# Patient Record
Sex: Female | Born: 1940 | Race: White | Hispanic: No | Smoking: Former smoker
Health system: Southern US, Community
[De-identification: ages and names within clinical notes are randomized; demographics above are authoritative.]

## PROBLEM LIST (undated history)

## (undated) DIAGNOSIS — Z923 Personal history of irradiation: Secondary | ICD-10-CM

## (undated) DIAGNOSIS — Z9221 Personal history of antineoplastic chemotherapy: Secondary | ICD-10-CM

## (undated) DIAGNOSIS — K5792 Diverticulitis of intestine, part unspecified, without perforation or abscess without bleeding: Secondary | ICD-10-CM

## (undated) DIAGNOSIS — T7840XA Allergy, unspecified, initial encounter: Secondary | ICD-10-CM

## (undated) DIAGNOSIS — E079 Disorder of thyroid, unspecified: Secondary | ICD-10-CM

## (undated) DIAGNOSIS — J189 Pneumonia, unspecified organism: Secondary | ICD-10-CM

## (undated) DIAGNOSIS — K219 Gastro-esophageal reflux disease without esophagitis: Secondary | ICD-10-CM

## (undated) DIAGNOSIS — M1990 Unspecified osteoarthritis, unspecified site: Secondary | ICD-10-CM

## (undated) DIAGNOSIS — C50919 Malignant neoplasm of unspecified site of unspecified female breast: Secondary | ICD-10-CM

## (undated) DIAGNOSIS — C801 Malignant (primary) neoplasm, unspecified: Secondary | ICD-10-CM

## (undated) DIAGNOSIS — K469 Unspecified abdominal hernia without obstruction or gangrene: Secondary | ICD-10-CM

## (undated) HISTORY — PX: ABDOMINAL HYSTERECTOMY: SHX81

## (undated) HISTORY — DX: Pneumonia, unspecified organism: J18.9

## (undated) HISTORY — DX: Malignant (primary) neoplasm, unspecified: C80.1

## (undated) HISTORY — PX: CHOLECYSTECTOMY: SHX55

## (undated) HISTORY — DX: Unspecified abdominal hernia without obstruction or gangrene: K46.9

## (undated) HISTORY — DX: Unspecified osteoarthritis, unspecified site: M19.90

## (undated) HISTORY — DX: Diverticulitis of intestine, part unspecified, without perforation or abscess without bleeding: K57.92

## (undated) HISTORY — DX: Disorder of thyroid, unspecified: E07.9

## (undated) HISTORY — DX: Gastro-esophageal reflux disease without esophagitis: K21.9

## (undated) HISTORY — DX: Allergy, unspecified, initial encounter: T78.40XA

---

## 1998-03-11 ENCOUNTER — Emergency Department (HOSPITAL_COMMUNITY): Admission: EM | Admit: 1998-03-11 | Discharge: 1998-03-11 | Payer: Self-pay | Admitting: Emergency Medicine

## 1998-12-27 ENCOUNTER — Encounter: Payer: Self-pay | Admitting: Emergency Medicine

## 1998-12-28 ENCOUNTER — Encounter: Payer: Self-pay | Admitting: Emergency Medicine

## 1998-12-28 ENCOUNTER — Inpatient Hospital Stay (HOSPITAL_COMMUNITY): Admission: EM | Admit: 1998-12-28 | Discharge: 1998-12-31 | Payer: Self-pay | Admitting: Emergency Medicine

## 1999-01-09 ENCOUNTER — Encounter: Admission: RE | Admit: 1999-01-09 | Discharge: 1999-01-09 | Payer: Self-pay | Admitting: Family Medicine

## 1999-01-31 ENCOUNTER — Ambulatory Visit (HOSPITAL_COMMUNITY): Admission: RE | Admit: 1999-01-31 | Discharge: 1999-01-31 | Payer: Self-pay | Admitting: *Deleted

## 1999-10-06 ENCOUNTER — Encounter: Payer: Self-pay | Admitting: Internal Medicine

## 1999-10-06 ENCOUNTER — Ambulatory Visit (HOSPITAL_COMMUNITY): Admission: RE | Admit: 1999-10-06 | Discharge: 1999-10-06 | Payer: Self-pay | Admitting: Internal Medicine

## 2000-02-15 ENCOUNTER — Encounter: Payer: Self-pay | Admitting: Emergency Medicine

## 2000-02-15 ENCOUNTER — Emergency Department (HOSPITAL_COMMUNITY): Admission: EM | Admit: 2000-02-15 | Discharge: 2000-02-15 | Payer: Self-pay | Admitting: Emergency Medicine

## 2000-02-17 ENCOUNTER — Encounter: Payer: Self-pay | Admitting: Family Medicine

## 2000-02-17 ENCOUNTER — Ambulatory Visit (HOSPITAL_COMMUNITY): Admission: RE | Admit: 2000-02-17 | Discharge: 2000-02-17 | Payer: Self-pay | Admitting: Family Medicine

## 2000-04-20 ENCOUNTER — Encounter: Payer: Self-pay | Admitting: Internal Medicine

## 2000-04-20 ENCOUNTER — Ambulatory Visit (HOSPITAL_COMMUNITY): Admission: RE | Admit: 2000-04-20 | Discharge: 2000-04-20 | Payer: Self-pay | Admitting: Internal Medicine

## 2003-02-09 ENCOUNTER — Encounter: Admission: RE | Admit: 2003-02-09 | Discharge: 2003-02-09 | Payer: Self-pay | Admitting: Gastroenterology

## 2003-02-28 ENCOUNTER — Encounter (INDEPENDENT_AMBULATORY_CARE_PROVIDER_SITE_OTHER): Payer: Self-pay | Admitting: *Deleted

## 2003-02-28 ENCOUNTER — Ambulatory Visit (HOSPITAL_COMMUNITY): Admission: RE | Admit: 2003-02-28 | Discharge: 2003-02-28 | Payer: Self-pay | Admitting: Gastroenterology

## 2003-05-08 ENCOUNTER — Encounter (INDEPENDENT_AMBULATORY_CARE_PROVIDER_SITE_OTHER): Payer: Self-pay | Admitting: Specialist

## 2003-05-08 ENCOUNTER — Observation Stay (HOSPITAL_COMMUNITY): Admission: RE | Admit: 2003-05-08 | Discharge: 2003-05-09 | Payer: Self-pay | Admitting: Surgery

## 2003-10-27 ENCOUNTER — Emergency Department (HOSPITAL_COMMUNITY): Admission: EM | Admit: 2003-10-27 | Discharge: 2003-10-27 | Payer: Self-pay | Admitting: Emergency Medicine

## 2004-04-20 ENCOUNTER — Emergency Department (HOSPITAL_COMMUNITY): Admission: EM | Admit: 2004-04-20 | Discharge: 2004-04-20 | Payer: Self-pay | Admitting: Emergency Medicine

## 2004-10-13 ENCOUNTER — Encounter: Admission: RE | Admit: 2004-10-13 | Discharge: 2004-10-13 | Payer: Self-pay | Admitting: Emergency Medicine

## 2004-10-22 ENCOUNTER — Encounter: Admission: RE | Admit: 2004-10-22 | Discharge: 2004-10-22 | Payer: Self-pay | Admitting: Emergency Medicine

## 2004-10-27 ENCOUNTER — Encounter: Admission: RE | Admit: 2004-10-27 | Discharge: 2004-10-27 | Payer: Self-pay | Admitting: Emergency Medicine

## 2005-12-17 ENCOUNTER — Encounter: Admission: RE | Admit: 2005-12-17 | Discharge: 2005-12-17 | Payer: Self-pay | Admitting: Emergency Medicine

## 2006-01-07 ENCOUNTER — Encounter: Admission: RE | Admit: 2006-01-07 | Discharge: 2006-01-07 | Payer: Self-pay | Admitting: Emergency Medicine

## 2006-06-11 ENCOUNTER — Encounter: Admission: RE | Admit: 2006-06-11 | Discharge: 2006-06-11 | Payer: Self-pay | Admitting: Emergency Medicine

## 2007-04-11 ENCOUNTER — Encounter (INDEPENDENT_AMBULATORY_CARE_PROVIDER_SITE_OTHER): Payer: Self-pay | Admitting: Obstetrics and Gynecology

## 2007-04-11 ENCOUNTER — Ambulatory Visit (HOSPITAL_COMMUNITY): Admission: RE | Admit: 2007-04-11 | Discharge: 2007-04-11 | Payer: Self-pay | Admitting: Obstetrics and Gynecology

## 2009-03-23 HISTORY — PX: BREAST LUMPECTOMY: SHX2

## 2009-06-10 ENCOUNTER — Encounter: Admission: RE | Admit: 2009-06-10 | Discharge: 2009-06-10 | Payer: Self-pay | Admitting: Family Medicine

## 2009-06-10 DIAGNOSIS — C50812 Malignant neoplasm of overlapping sites of left female breast: Secondary | ICD-10-CM | POA: Insufficient documentation

## 2009-06-12 ENCOUNTER — Ambulatory Visit: Payer: Self-pay | Admitting: Oncology

## 2009-06-19 LAB — CBC WITH DIFFERENTIAL/PLATELET
BASO%: 0.6 % (ref 0.0–2.0)
Basophils Absolute: 0.1 10*3/uL (ref 0.0–0.1)
EOS%: 4.6 % (ref 0.0–7.0)
Eosinophils Absolute: 0.4 10*3/uL (ref 0.0–0.5)
HCT: 44.2 % (ref 34.8–46.6)
HGB: 15.3 g/dL (ref 11.6–15.9)
LYMPH%: 31.6 % (ref 14.0–49.7)
MCH: 33.1 pg (ref 25.1–34.0)
MCHC: 34.6 g/dL (ref 31.5–36.0)
MCV: 95.7 fL (ref 79.5–101.0)
MONO#: 0.6 10*3/uL (ref 0.1–0.9)
MONO%: 6.1 % (ref 0.0–14.0)
NEUT#: 5.3 10*3/uL (ref 1.5–6.5)
NEUT%: 57.1 % (ref 38.4–76.8)
Platelets: 297 10*3/uL (ref 145–400)
RBC: 4.62 10*6/uL (ref 3.70–5.45)
RDW: 12.5 % (ref 11.2–14.5)
WBC: 9.3 10*3/uL (ref 3.9–10.3)
lymph#: 2.9 10*3/uL (ref 0.9–3.3)

## 2009-06-19 LAB — COMPREHENSIVE METABOLIC PANEL
ALT: 11 U/L (ref 0–35)
AST: 15 U/L (ref 0–37)
Albumin: 4.3 g/dL (ref 3.5–5.2)
Alkaline Phosphatase: 79 U/L (ref 39–117)
BUN: 17 mg/dL (ref 6–23)
CO2: 24 mEq/L (ref 19–32)
Calcium: 9.7 mg/dL (ref 8.4–10.5)
Chloride: 102 mEq/L (ref 96–112)
Creatinine, Ser: 0.95 mg/dL (ref 0.40–1.20)
Glucose, Bld: 108 mg/dL — ABNORMAL HIGH (ref 70–99)
Potassium: 4.2 mEq/L (ref 3.5–5.3)
Sodium: 138 mEq/L (ref 135–145)
Total Bilirubin: 0.7 mg/dL (ref 0.3–1.2)
Total Protein: 7.5 g/dL (ref 6.0–8.3)

## 2009-06-19 LAB — CANCER ANTIGEN 27.29: CA 27.29: 16 U/mL (ref 0–39)

## 2009-06-19 LAB — LACTATE DEHYDROGENASE: LDH: 164 U/L (ref 94–250)

## 2009-06-25 ENCOUNTER — Ambulatory Visit (HOSPITAL_COMMUNITY): Admission: RE | Admit: 2009-06-25 | Discharge: 2009-06-25 | Payer: Self-pay | Admitting: Oncology

## 2009-07-07 ENCOUNTER — Encounter: Admission: RE | Admit: 2009-07-07 | Discharge: 2009-07-07 | Payer: Self-pay | Admitting: Oncology

## 2009-07-17 ENCOUNTER — Encounter (INDEPENDENT_AMBULATORY_CARE_PROVIDER_SITE_OTHER): Payer: Self-pay | Admitting: Surgery

## 2009-07-17 ENCOUNTER — Ambulatory Visit (HOSPITAL_COMMUNITY): Admission: RE | Admit: 2009-07-17 | Discharge: 2009-07-18 | Payer: Self-pay | Admitting: Surgery

## 2009-07-23 ENCOUNTER — Ambulatory Visit: Payer: Self-pay | Admitting: Oncology

## 2009-07-26 ENCOUNTER — Ambulatory Visit (HOSPITAL_COMMUNITY): Admission: RE | Admit: 2009-07-26 | Discharge: 2009-07-26 | Payer: Self-pay | Admitting: Oncology

## 2009-07-30 ENCOUNTER — Ambulatory Visit (HOSPITAL_COMMUNITY): Admission: RE | Admit: 2009-07-30 | Discharge: 2009-07-30 | Payer: Self-pay | Admitting: Oncology

## 2009-08-07 ENCOUNTER — Encounter: Admission: RE | Admit: 2009-08-07 | Discharge: 2009-08-07 | Payer: Self-pay | Admitting: Oncology

## 2009-08-22 ENCOUNTER — Other Ambulatory Visit: Payer: Self-pay | Admitting: Physician Assistant

## 2009-08-22 LAB — CBC WITH DIFFERENTIAL/PLATELET
BASO%: 0.7 % (ref 0.0–2.0)
Basophils Absolute: 0.1 10*3/uL (ref 0.0–0.1)
EOS%: 6.3 % (ref 0.0–7.0)
Eosinophils Absolute: 0.6 10*3/uL — ABNORMAL HIGH (ref 0.0–0.5)
HCT: 42.3 % (ref 34.8–46.6)
HGB: 14.7 g/dL (ref 11.6–15.9)
LYMPH%: 30.5 % (ref 14.0–49.7)
MCH: 33.1 pg (ref 25.1–34.0)
MCHC: 34.8 g/dL (ref 31.5–36.0)
MCV: 95.2 fL (ref 79.5–101.0)
MONO#: 0.8 10*3/uL (ref 0.1–0.9)
MONO%: 8.5 % (ref 0.0–14.0)
NEUT#: 5.4 10*3/uL (ref 1.5–6.5)
NEUT%: 54 % (ref 38.4–76.8)
Platelets: 330 10*3/uL (ref 145–400)
RBC: 4.45 10*6/uL (ref 3.70–5.45)
RDW: 12.7 % (ref 11.2–14.5)
WBC: 9.9 10*3/uL (ref 3.9–10.3)
lymph#: 3 10*3/uL (ref 0.9–3.3)

## 2009-08-22 LAB — COMPREHENSIVE METABOLIC PANEL
ALT: 14 U/L (ref 0–35)
AST: 20 U/L (ref 0–37)
Albumin: 3.7 g/dL (ref 3.5–5.2)
Alkaline Phosphatase: 77 U/L (ref 39–117)
BUN: 18 mg/dL (ref 6–23)
CO2: 28 mEq/L (ref 19–32)
Calcium: 9.1 mg/dL (ref 8.4–10.5)
Chloride: 104 mEq/L (ref 96–112)
Creatinine, Ser: 0.94 mg/dL (ref 0.40–1.20)
Glucose, Bld: 93 mg/dL (ref 70–99)
Potassium: 3.7 mEq/L (ref 3.5–5.3)
Sodium: 138 mEq/L (ref 135–145)
Total Bilirubin: 0.9 mg/dL (ref 0.3–1.2)
Total Protein: 7.3 g/dL (ref 6.0–8.3)

## 2009-08-27 ENCOUNTER — Ambulatory Visit: Payer: Self-pay | Admitting: Oncology

## 2009-09-05 LAB — CBC WITH DIFFERENTIAL/PLATELET
BASO%: 0.6 % (ref 0.0–2.0)
Basophils Absolute: 0 10*3/uL (ref 0.0–0.1)
EOS%: 6.1 % (ref 0.0–7.0)
Eosinophils Absolute: 0.3 10*3/uL (ref 0.0–0.5)
HCT: 43.7 % (ref 34.8–46.6)
HGB: 14.9 g/dL (ref 11.6–15.9)
LYMPH%: 62.5 % — ABNORMAL HIGH (ref 14.0–49.7)
MCH: 31.8 pg (ref 25.1–34.0)
MCHC: 34.1 g/dL (ref 31.5–36.0)
MCV: 93.4 fL (ref 79.5–101.0)
MONO#: 1.2 10*3/uL — ABNORMAL HIGH (ref 0.1–0.9)
MONO%: 24.2 % — ABNORMAL HIGH (ref 0.0–14.0)
NEUT#: 0.3 10*3/uL — CL (ref 1.5–6.5)
NEUT%: 6.6 % — ABNORMAL LOW (ref 38.4–76.8)
Platelets: 256 10*3/uL (ref 145–400)
RBC: 4.68 10*6/uL (ref 3.70–5.45)
RDW: 12.6 % (ref 11.2–14.5)
WBC: 5.1 10*3/uL (ref 3.9–10.3)
lymph#: 3.2 10*3/uL (ref 0.9–3.3)
nRBC: 0 % (ref 0–0)

## 2009-09-05 LAB — COMPREHENSIVE METABOLIC PANEL
ALT: 18 U/L (ref 0–35)
AST: 20 U/L (ref 0–37)
Albumin: 3.6 g/dL (ref 3.5–5.2)
Alkaline Phosphatase: 93 U/L (ref 39–117)
BUN: 22 mg/dL (ref 6–23)
CO2: 26 mEq/L (ref 19–32)
Calcium: 9.1 mg/dL (ref 8.4–10.5)
Chloride: 102 mEq/L (ref 96–112)
Creatinine, Ser: 1.29 mg/dL — ABNORMAL HIGH (ref 0.40–1.20)
Glucose, Bld: 138 mg/dL — ABNORMAL HIGH (ref 70–99)
Potassium: 3.8 mEq/L (ref 3.5–5.3)
Sodium: 138 mEq/L (ref 135–145)
Total Bilirubin: 0.9 mg/dL (ref 0.3–1.2)
Total Protein: 7.3 g/dL (ref 6.0–8.3)

## 2009-09-15 ENCOUNTER — Inpatient Hospital Stay (HOSPITAL_COMMUNITY): Admission: EM | Admit: 2009-09-15 | Discharge: 2009-09-24 | Payer: Self-pay | Admitting: Emergency Medicine

## 2009-09-26 ENCOUNTER — Ambulatory Visit: Payer: Self-pay | Admitting: Oncology

## 2009-09-26 LAB — CBC WITH DIFFERENTIAL/PLATELET
BASO%: 0.9 % (ref 0.0–2.0)
Basophils Absolute: 0.1 10*3/uL (ref 0.0–0.1)
EOS%: 6.9 % (ref 0.0–7.0)
Eosinophils Absolute: 0.6 10*3/uL — ABNORMAL HIGH (ref 0.0–0.5)
HCT: 36.9 % (ref 34.8–46.6)
HGB: 12.6 g/dL (ref 11.6–15.9)
LYMPH%: 24 % (ref 14.0–49.7)
MCH: 32.8 pg (ref 25.1–34.0)
MCHC: 34.3 g/dL (ref 31.5–36.0)
MCV: 95.8 fL (ref 79.5–101.0)
MONO#: 0.8 10*3/uL (ref 0.1–0.9)
MONO%: 8.6 % (ref 0.0–14.0)
NEUT#: 5.3 10*3/uL (ref 1.5–6.5)
NEUT%: 59.6 % (ref 38.4–76.8)
Platelets: 475 10*3/uL — ABNORMAL HIGH (ref 145–400)
RBC: 3.85 10*6/uL (ref 3.70–5.45)
RDW: 13.9 % (ref 11.2–14.5)
WBC: 8.9 10*3/uL (ref 3.9–10.3)
lymph#: 2.1 10*3/uL (ref 0.9–3.3)

## 2009-10-10 LAB — CBC WITH DIFFERENTIAL/PLATELET
BASO%: 0.3 % (ref 0.0–2.0)
Basophils Absolute: 0 10*3/uL (ref 0.0–0.1)
EOS%: 3.2 % (ref 0.0–7.0)
Eosinophils Absolute: 0.3 10*3/uL (ref 0.0–0.5)
HCT: 41.5 % (ref 34.8–46.6)
HGB: 14.2 g/dL (ref 11.6–15.9)
LYMPH%: 33.5 % (ref 14.0–49.7)
MCH: 32.7 pg (ref 25.1–34.0)
MCHC: 34.2 g/dL (ref 31.5–36.0)
MCV: 95.6 fL (ref 79.5–101.0)
MONO#: 0.7 10*3/uL (ref 0.1–0.9)
MONO%: 7.3 % (ref 0.0–14.0)
NEUT#: 5.3 10*3/uL (ref 1.5–6.5)
NEUT%: 55.7 % (ref 38.4–76.8)
Platelets: 274 10*3/uL (ref 145–400)
RBC: 4.34 10*6/uL (ref 3.70–5.45)
RDW: 13.5 % (ref 11.2–14.5)
WBC: 9.6 10*3/uL (ref 3.9–10.3)
lymph#: 3.2 10*3/uL (ref 0.9–3.3)

## 2009-10-17 LAB — CBC WITH DIFFERENTIAL/PLATELET
BASO%: 0.4 % (ref 0.0–2.0)
Basophils Absolute: 0 10*3/uL (ref 0.0–0.1)
EOS%: 5.4 % (ref 0.0–7.0)
Eosinophils Absolute: 0.5 10*3/uL (ref 0.0–0.5)
HCT: 41.5 % (ref 34.8–46.6)
HGB: 14.2 g/dL (ref 11.6–15.9)
LYMPH%: 34.4 % (ref 14.0–49.7)
MCH: 32.7 pg (ref 25.1–34.0)
MCHC: 34.2 g/dL (ref 31.5–36.0)
MCV: 95.6 fL (ref 79.5–101.0)
MONO#: 0.7 10*3/uL (ref 0.1–0.9)
MONO%: 7.6 % (ref 0.0–14.0)
NEUT#: 4.8 10*3/uL (ref 1.5–6.5)
NEUT%: 52.2 % (ref 38.4–76.8)
Platelets: 355 10*3/uL (ref 145–400)
RBC: 4.34 10*6/uL (ref 3.70–5.45)
RDW: 13.1 % (ref 11.2–14.5)
WBC: 9.2 10*3/uL (ref 3.9–10.3)
lymph#: 3.2 10*3/uL (ref 0.9–3.3)
nRBC: 0 % (ref 0–0)

## 2009-10-25 LAB — CBC WITH DIFFERENTIAL/PLATELET
BASO%: 0.8 % (ref 0.0–2.0)
Basophils Absolute: 0 10*3/uL (ref 0.0–0.1)
EOS%: 9.7 % — ABNORMAL HIGH (ref 0.0–7.0)
Eosinophils Absolute: 0.3 10*3/uL (ref 0.0–0.5)
HCT: 38 % (ref 34.8–46.6)
HGB: 13.3 g/dL (ref 11.6–15.9)
LYMPH%: 82.9 % — ABNORMAL HIGH (ref 14.0–49.7)
MCH: 32.9 pg (ref 25.1–34.0)
MCHC: 34.9 g/dL (ref 31.5–36.0)
MCV: 94.3 fL (ref 79.5–101.0)
MONO#: 0.1 10*3/uL (ref 0.1–0.9)
MONO%: 4.8 % (ref 0.0–14.0)
NEUT#: 0 10*3/uL — CL (ref 1.5–6.5)
NEUT%: 1.8 % — ABNORMAL LOW (ref 38.4–76.8)
Platelets: 158 10*3/uL (ref 145–400)
RBC: 4.03 10*6/uL (ref 3.70–5.45)
RDW: 13 % (ref 11.2–14.5)
WBC: 2.7 10*3/uL — ABNORMAL LOW (ref 3.9–10.3)
lymph#: 2.2 10*3/uL (ref 0.9–3.3)

## 2009-10-26 ENCOUNTER — Ambulatory Visit: Payer: Self-pay | Admitting: Hematology & Oncology

## 2009-10-26 ENCOUNTER — Inpatient Hospital Stay (HOSPITAL_COMMUNITY): Admission: EM | Admit: 2009-10-26 | Discharge: 2009-10-29 | Payer: Self-pay | Admitting: Emergency Medicine

## 2009-10-28 ENCOUNTER — Ambulatory Visit: Payer: Self-pay | Admitting: Oncology

## 2009-10-29 ENCOUNTER — Ambulatory Visit: Payer: Self-pay | Admitting: Oncology

## 2009-10-31 LAB — CBC WITH DIFFERENTIAL/PLATELET
BASO%: 0.8 % (ref 0.0–2.0)
Basophils Absolute: 0.1 10*3/uL (ref 0.0–0.1)
EOS%: 3 % (ref 0.0–7.0)
Eosinophils Absolute: 0.3 10*3/uL (ref 0.0–0.5)
HCT: 35.9 % (ref 34.8–46.6)
HGB: 12.6 g/dL (ref 11.6–15.9)
LYMPH%: 28.5 % (ref 14.0–49.7)
MCH: 33.3 pg (ref 25.1–34.0)
MCHC: 35.2 g/dL (ref 31.5–36.0)
MCV: 94.6 fL (ref 79.5–101.0)
MONO#: 0.6 10*3/uL (ref 0.1–0.9)
MONO%: 7.2 % (ref 0.0–14.0)
NEUT#: 5.2 10*3/uL (ref 1.5–6.5)
NEUT%: 60.5 % (ref 38.4–76.8)
Platelets: 303 10*3/uL (ref 145–400)
RBC: 3.79 10*6/uL (ref 3.70–5.45)
RDW: 13.2 % (ref 11.2–14.5)
WBC: 8.7 10*3/uL (ref 3.9–10.3)
lymph#: 2.5 10*3/uL (ref 0.9–3.3)

## 2009-10-31 LAB — COMPREHENSIVE METABOLIC PANEL
ALT: 23 U/L (ref 0–35)
AST: 27 U/L (ref 0–37)
Albumin: 3.9 g/dL (ref 3.5–5.2)
Alkaline Phosphatase: 92 U/L (ref 39–117)
BUN: 12 mg/dL (ref 6–23)
CO2: 22 mEq/L (ref 19–32)
Calcium: 9 mg/dL (ref 8.4–10.5)
Chloride: 104 mEq/L (ref 96–112)
Creatinine, Ser: 0.82 mg/dL (ref 0.40–1.20)
Glucose, Bld: 102 mg/dL — ABNORMAL HIGH (ref 70–99)
Potassium: 3.6 mEq/L (ref 3.5–5.3)
Sodium: 139 mEq/L (ref 135–145)
Total Bilirubin: 0.3 mg/dL (ref 0.3–1.2)
Total Protein: 6.5 g/dL (ref 6.0–8.3)

## 2009-11-07 LAB — CBC WITH DIFFERENTIAL/PLATELET
BASO%: 0.9 % (ref 0.0–2.0)
Basophils Absolute: 0.1 10*3/uL (ref 0.0–0.1)
EOS%: 1.4 % (ref 0.0–7.0)
Eosinophils Absolute: 0.1 10*3/uL (ref 0.0–0.5)
HCT: 38.4 % (ref 34.8–46.6)
HGB: 13.1 g/dL (ref 11.6–15.9)
LYMPH%: 29.2 % (ref 14.0–49.7)
MCH: 32.4 pg (ref 25.1–34.0)
MCHC: 34.1 g/dL (ref 31.5–36.0)
MCV: 95 fL (ref 79.5–101.0)
MONO#: 0.7 10*3/uL (ref 0.1–0.9)
MONO%: 9.1 % (ref 0.0–14.0)
NEUT#: 4.8 10*3/uL (ref 1.5–6.5)
NEUT%: 59.4 % (ref 38.4–76.8)
Platelets: 440 10*3/uL — ABNORMAL HIGH (ref 145–400)
RBC: 4.04 10*6/uL (ref 3.70–5.45)
RDW: 13.3 % (ref 11.2–14.5)
WBC: 8 10*3/uL (ref 3.9–10.3)
lymph#: 2.4 10*3/uL (ref 0.9–3.3)

## 2009-11-22 ENCOUNTER — Ambulatory Visit: Admission: RE | Admit: 2009-11-22 | Discharge: 2009-12-24 | Payer: Self-pay | Admitting: Radiation Oncology

## 2009-11-29 ENCOUNTER — Encounter: Admission: RE | Admit: 2009-11-29 | Discharge: 2009-11-29 | Payer: Self-pay | Admitting: Radiation Oncology

## 2009-12-02 ENCOUNTER — Ambulatory Visit: Payer: Self-pay | Admitting: Oncology

## 2009-12-02 ENCOUNTER — Encounter: Admission: RE | Admit: 2009-12-02 | Discharge: 2009-12-02 | Payer: Self-pay | Admitting: Radiation Oncology

## 2009-12-10 LAB — COMPREHENSIVE METABOLIC PANEL
ALT: 12 U/L (ref 0–35)
AST: 17 U/L (ref 0–37)
Albumin: 4.2 g/dL (ref 3.5–5.2)
Alkaline Phosphatase: 81 U/L (ref 39–117)
BUN: 20 mg/dL (ref 6–23)
CO2: 25 mEq/L (ref 19–32)
Calcium: 9.2 mg/dL (ref 8.4–10.5)
Chloride: 105 mEq/L (ref 96–112)
Creatinine, Ser: 0.86 mg/dL (ref 0.40–1.20)
Glucose, Bld: 126 mg/dL — ABNORMAL HIGH (ref 70–99)
Potassium: 3.6 mEq/L (ref 3.5–5.3)
Sodium: 141 mEq/L (ref 135–145)
Total Bilirubin: 0.5 mg/dL (ref 0.3–1.2)
Total Protein: 7.1 g/dL (ref 6.0–8.3)

## 2009-12-10 LAB — CBC WITH DIFFERENTIAL/PLATELET
BASO%: 0.2 % (ref 0.0–2.0)
Basophils Absolute: 0 10*3/uL (ref 0.0–0.1)
EOS%: 5.7 % (ref 0.0–7.0)
Eosinophils Absolute: 0.4 10*3/uL (ref 0.0–0.5)
HCT: 40.5 % (ref 34.8–46.6)
HGB: 14 g/dL (ref 11.6–15.9)
LYMPH%: 36.3 % (ref 14.0–49.7)
MCH: 32.9 pg (ref 25.1–34.0)
MCHC: 34.5 g/dL (ref 31.5–36.0)
MCV: 95.3 fL (ref 79.5–101.0)
MONO#: 0.5 10*3/uL (ref 0.1–0.9)
MONO%: 6.1 % (ref 0.0–14.0)
NEUT#: 3.8 10*3/uL (ref 1.5–6.5)
NEUT%: 51.7 % (ref 38.4–76.8)
Platelets: 328 10*3/uL (ref 145–400)
RBC: 4.25 10*6/uL (ref 3.70–5.45)
RDW: 12.9 % (ref 11.2–14.5)
WBC: 7.3 10*3/uL (ref 3.9–10.3)
lymph#: 2.7 10*3/uL (ref 0.9–3.3)

## 2009-12-23 ENCOUNTER — Encounter: Admission: RE | Admit: 2009-12-23 | Discharge: 2009-12-23 | Payer: Self-pay | Admitting: Surgery

## 2009-12-23 ENCOUNTER — Ambulatory Visit (HOSPITAL_COMMUNITY): Admission: RE | Admit: 2009-12-23 | Discharge: 2009-12-24 | Payer: Self-pay | Admitting: Surgery

## 2009-12-23 ENCOUNTER — Encounter (INDEPENDENT_AMBULATORY_CARE_PROVIDER_SITE_OTHER): Payer: Self-pay | Admitting: Surgery

## 2010-01-10 ENCOUNTER — Ambulatory Visit
Admission: RE | Admit: 2010-01-10 | Discharge: 2010-03-25 | Payer: Self-pay | Source: Home / Self Care | Attending: Radiation Oncology | Admitting: Radiation Oncology

## 2010-01-10 ENCOUNTER — Ambulatory Visit: Payer: Self-pay | Admitting: Oncology

## 2010-03-13 ENCOUNTER — Ambulatory Visit: Payer: Self-pay | Admitting: Oncology

## 2010-04-12 ENCOUNTER — Encounter: Payer: Self-pay | Admitting: Emergency Medicine

## 2010-04-12 ENCOUNTER — Encounter: Payer: Self-pay | Admitting: Gastroenterology

## 2010-04-13 ENCOUNTER — Encounter: Payer: Self-pay | Admitting: Family Medicine

## 2010-04-13 ENCOUNTER — Encounter: Payer: Self-pay | Admitting: Oncology

## 2010-04-18 ENCOUNTER — Ambulatory Visit: Payer: Self-pay | Admitting: Oncology

## 2010-04-22 LAB — COMPREHENSIVE METABOLIC PANEL
ALT: 9 U/L (ref 0–35)
AST: 12 U/L (ref 0–37)
Albumin: 4 g/dL (ref 3.5–5.2)
Alkaline Phosphatase: 84 U/L (ref 39–117)
BUN: 21 mg/dL (ref 6–23)
CO2: 23 mEq/L (ref 19–32)
Calcium: 9.4 mg/dL (ref 8.4–10.5)
Chloride: 101 mEq/L (ref 96–112)
Creatinine, Ser: 0.85 mg/dL (ref 0.40–1.20)
Glucose, Bld: 103 mg/dL — ABNORMAL HIGH (ref 70–99)
Potassium: 4.2 mEq/L (ref 3.5–5.3)
Sodium: 136 mEq/L (ref 135–145)
Total Bilirubin: 0.5 mg/dL (ref 0.3–1.2)
Total Protein: 6.6 g/dL (ref 6.0–8.3)

## 2010-04-22 LAB — CBC WITH DIFFERENTIAL/PLATELET
BASO%: 1.9 % (ref 0.0–2.0)
Basophils Absolute: 0.1 10*3/uL (ref 0.0–0.1)
EOS%: 5.6 % (ref 0.0–7.0)
Eosinophils Absolute: 0.4 10*3/uL (ref 0.0–0.5)
HCT: 41 % (ref 34.8–46.6)
HGB: 14.2 g/dL (ref 11.6–15.9)
LYMPH%: 27.9 % (ref 14.0–49.7)
MCH: 32.3 pg (ref 25.1–34.0)
MCHC: 34.8 g/dL (ref 31.5–36.0)
MCV: 93 fL (ref 79.5–101.0)
MONO#: 0.7 10*3/uL (ref 0.1–0.9)
MONO%: 10 % (ref 0.0–14.0)
NEUT#: 3.9 10*3/uL (ref 1.5–6.5)
NEUT%: 54.6 % (ref 38.4–76.8)
Platelets: 274 10*3/uL (ref 145–400)
RBC: 4.41 10*6/uL (ref 3.70–5.45)
RDW: 13.2 % (ref 11.2–14.5)
WBC: 7.1 10*3/uL (ref 3.9–10.3)
lymph#: 2 10*3/uL (ref 0.9–3.3)

## 2010-04-22 LAB — CANCER ANTIGEN 27.29: CA 27.29: 13 U/mL (ref 0–39)

## 2010-04-22 LAB — LACTATE DEHYDROGENASE: LDH: 162 U/L (ref 94–250)

## 2010-04-24 ENCOUNTER — Ambulatory Visit: Payer: Self-pay | Admitting: Radiation Oncology

## 2010-04-24 ENCOUNTER — Ambulatory Visit: Payer: MEDICARE | Attending: Radiation Oncology | Admitting: Radiation Oncology

## 2010-05-07 ENCOUNTER — Other Ambulatory Visit: Payer: Self-pay | Admitting: Oncology

## 2010-05-07 DIAGNOSIS — Z9889 Other specified postprocedural states: Secondary | ICD-10-CM

## 2010-06-05 LAB — CBC
HCT: 41.9 % (ref 36.0–46.0)
Hemoglobin: 14.1 g/dL (ref 12.0–15.0)
MCH: 32.2 pg (ref 26.0–34.0)
MCHC: 33.7 g/dL (ref 30.0–36.0)
MCV: 95.7 fL (ref 78.0–100.0)
Platelets: 323 10*3/uL (ref 150–400)
RBC: 4.38 MIL/uL (ref 3.87–5.11)
RDW: 12.6 % (ref 11.5–15.5)
WBC: 8.7 10*3/uL (ref 4.0–10.5)

## 2010-06-05 LAB — SURGICAL PCR SCREEN
MRSA, PCR: NEGATIVE
Staphylococcus aureus: NEGATIVE

## 2010-06-05 LAB — BASIC METABOLIC PANEL
BUN: 17 mg/dL (ref 6–23)
CO2: 26 mEq/L (ref 19–32)
Calcium: 9.1 mg/dL (ref 8.4–10.5)
Chloride: 106 mEq/L (ref 96–112)
Creatinine, Ser: 0.83 mg/dL (ref 0.4–1.2)
GFR calc Af Amer: >60 mL/min (ref >60)
GFR calc non Af Amer: >60 mL/min (ref >60)
Glucose, Bld: 108 mg/dL — ABNORMAL HIGH (ref 70–99)
Potassium: 4 mEq/L (ref 3.5–5.1)
Sodium: 138 mEq/L (ref 135–145)

## 2010-06-06 LAB — COMPREHENSIVE METABOLIC PANEL
ALT: 11 U/L (ref 0–35)
AST: 16 U/L (ref 0–37)
Albumin: 3.6 g/dL (ref 3.5–5.2)
Alkaline Phosphatase: 90 U/L (ref 39–117)
BUN: 20 mg/dL (ref 6–23)
CO2: 23 mEq/L (ref 19–32)
Calcium: 8.7 mg/dL (ref 8.4–10.5)
Chloride: 100 mEq/L (ref 96–112)
Creatinine, Ser: 1.27 mg/dL — ABNORMAL HIGH (ref 0.4–1.2)
GFR calc Af Amer: 51 mL/min — ABNORMAL LOW (ref >60)
GFR calc non Af Amer: 42 mL/min — ABNORMAL LOW (ref >60)
Glucose, Bld: 125 mg/dL — ABNORMAL HIGH (ref 70–99)
Potassium: 3.5 mEq/L (ref 3.5–5.1)
Sodium: 134 mEq/L — ABNORMAL LOW (ref 135–145)
Total Bilirubin: 1 mg/dL (ref 0.3–1.2)
Total Protein: 6.9 g/dL (ref 6.0–8.3)

## 2010-06-06 LAB — CULTURE, BLOOD (ROUTINE X 2)
Culture: NO GROWTH
Culture: NO GROWTH

## 2010-06-06 LAB — CBC
HCT: 30.5 % — ABNORMAL LOW (ref 36.0–46.0)
HCT: 31.1 % — ABNORMAL LOW (ref 36.0–46.0)
HCT: 31.7 % — ABNORMAL LOW (ref 36.0–46.0)
HCT: 36.1 % (ref 36.0–46.0)
Hemoglobin: 10.9 g/dL — ABNORMAL LOW (ref 12.0–15.0)
Hemoglobin: 10.9 g/dL — ABNORMAL LOW (ref 12.0–15.0)
Hemoglobin: 11 g/dL — ABNORMAL LOW (ref 12.0–15.0)
Hemoglobin: 13 g/dL (ref 12.0–15.0)
MCH: 33.3 pg (ref 26.0–34.0)
MCH: 33.4 pg (ref 26.0–34.0)
MCH: 33.5 pg (ref 26.0–34.0)
MCH: 33.5 pg (ref 26.0–34.0)
MCHC: 34.7 g/dL (ref 30.0–36.0)
MCHC: 35.1 g/dL (ref 30.0–36.0)
MCHC: 35.8 g/dL (ref 30.0–36.0)
MCHC: 35.9 g/dL (ref 30.0–36.0)
MCV: 93.3 fL (ref 78.0–100.0)
MCV: 93.5 fL (ref 78.0–100.0)
MCV: 95.6 fL (ref 78.0–100.0)
MCV: 95.8 fL (ref 78.0–100.0)
Platelets: 125 10*3/uL — ABNORMAL LOW (ref 150–400)
Platelets: 141 10*3/uL — ABNORMAL LOW (ref 150–400)
Platelets: 146 10*3/uL — ABNORMAL LOW (ref 150–400)
Platelets: 195 10*3/uL (ref 150–400)
RBC: 3.25 MIL/uL — ABNORMAL LOW (ref 3.87–5.11)
RBC: 3.26 MIL/uL — ABNORMAL LOW (ref 3.87–5.11)
RBC: 3.31 MIL/uL — ABNORMAL LOW (ref 3.87–5.11)
RBC: 3.87 MIL/uL (ref 3.87–5.11)
RDW: 12.6 % (ref 11.5–15.5)
RDW: 12.9 % (ref 11.5–15.5)
RDW: 13.1 % (ref 11.5–15.5)
RDW: 13.3 % (ref 11.5–15.5)
WBC: 1.4 10*3/uL — CL (ref 4.0–10.5)
WBC: 2.1 10*3/uL — ABNORMAL LOW (ref 4.0–10.5)
WBC: 3.5 10*3/uL — ABNORMAL LOW (ref 4.0–10.5)
WBC: 5.5 10*3/uL (ref 4.0–10.5)

## 2010-06-06 LAB — BASIC METABOLIC PANEL
BUN: 10 mg/dL (ref 6–23)
BUN: 11 mg/dL (ref 6–23)
BUN: 16 mg/dL (ref 6–23)
CO2: 24 mEq/L (ref 19–32)
CO2: 25 mEq/L (ref 19–32)
CO2: 25 mEq/L (ref 19–32)
Calcium: 8.1 mg/dL — ABNORMAL LOW (ref 8.4–10.5)
Calcium: 8.3 mg/dL — ABNORMAL LOW (ref 8.4–10.5)
Calcium: 8.5 mg/dL (ref 8.4–10.5)
Chloride: 108 mEq/L (ref 96–112)
Chloride: 110 mEq/L (ref 96–112)
Chloride: 111 mEq/L (ref 96–112)
Creatinine, Ser: 0.88 mg/dL (ref 0.4–1.2)
Creatinine, Ser: 0.89 mg/dL (ref 0.4–1.2)
Creatinine, Ser: 1.15 mg/dL (ref 0.4–1.2)
GFR calc Af Amer: 57 mL/min — ABNORMAL LOW (ref >60)
GFR calc Af Amer: >60 mL/min (ref >60)
GFR calc Af Amer: >60 mL/min (ref >60)
GFR calc non Af Amer: 47 mL/min — ABNORMAL LOW (ref >60)
GFR calc non Af Amer: >60 mL/min (ref >60)
GFR calc non Af Amer: >60 mL/min (ref >60)
Glucose, Bld: 98 mg/dL (ref 70–99)
Glucose, Bld: 98 mg/dL (ref 70–99)
Glucose, Bld: 99 mg/dL (ref 70–99)
Potassium: 3.3 mEq/L — ABNORMAL LOW (ref 3.5–5.1)
Potassium: 3.3 mEq/L — ABNORMAL LOW (ref 3.5–5.1)
Potassium: 3.7 mEq/L (ref 3.5–5.1)
Sodium: 139 mEq/L (ref 135–145)
Sodium: 139 mEq/L (ref 135–145)
Sodium: 141 mEq/L (ref 135–145)

## 2010-06-06 LAB — URINE CULTURE
Colony Count: 3000
Culture  Setup Time: 201108062016

## 2010-06-06 LAB — URINALYSIS, ROUTINE W REFLEX MICROSCOPIC
Glucose, UA: NEGATIVE mg/dL
Hgb urine dipstick: NEGATIVE
Nitrite: NEGATIVE
Protein, ur: 100 mg/dL — AB
Specific Gravity, Urine: 1.026 (ref 1.005–1.030)
Urobilinogen, UA: 1 mg/dL (ref 0.0–1.0)
pH: 5 (ref 5.0–8.0)

## 2010-06-06 LAB — URINE MICROSCOPIC-ADD ON

## 2010-06-06 LAB — DIFFERENTIAL
Basophils Absolute: 0 10*3/uL (ref 0.0–0.1)
Basophils Absolute: 0 10*3/uL (ref 0.0–0.1)
Basophils Relative: 0 % (ref 0–1)
Basophils Relative: 0 % (ref 0–1)
Eosinophils Absolute: 0 10*3/uL (ref 0.0–0.7)
Eosinophils Absolute: 0.3 10*3/uL (ref 0.0–0.7)
Eosinophils Relative: 2 % (ref 0–5)
Eosinophils Relative: 5 % (ref 0–5)
Lymphocytes Relative: 38 % (ref 12–46)
Lymphocytes Relative: 65 % — ABNORMAL HIGH (ref 12–46)
Lymphs Abs: 0.9 10*3/uL (ref 0.7–4.0)
Lymphs Abs: 2.1 10*3/uL (ref 0.7–4.0)
Monocytes Absolute: 0.4 10*3/uL (ref 0.1–1.0)
Monocytes Absolute: 0.6 10*3/uL (ref 0.1–1.0)
Monocytes Relative: 10 % (ref 3–12)
Monocytes Relative: 28 % — ABNORMAL HIGH (ref 3–12)
Neutro Abs: 0.1 10*3/uL — ABNORMAL LOW (ref 1.7–7.7)
Neutro Abs: 2.5 10*3/uL (ref 1.7–7.7)
Neutrophils Relative %: 46 % (ref 43–77)
Neutrophils Relative %: 5 % — ABNORMAL LOW (ref 43–77)

## 2010-06-06 LAB — MAGNESIUM
Magnesium: 1.6 mg/dL (ref 1.5–2.5)
Magnesium: 1.6 mg/dL (ref 1.5–2.5)

## 2010-06-06 LAB — TSH: TSH: 1.078 u[IU]/mL (ref 0.350–4.500)

## 2010-06-06 LAB — PROCALCITONIN: Procalcitonin: <0.5 ng/mL

## 2010-06-06 LAB — PATHOLOGIST SMEAR REVIEW

## 2010-06-06 LAB — LACTIC ACID, PLASMA: Lactic Acid, Venous: 2.1 mmol/L (ref 0.5–2.2)

## 2010-06-08 LAB — COMPREHENSIVE METABOLIC PANEL
ALT: 17 U/L (ref 0–35)
AST: 17 U/L (ref 0–37)
Albumin: 3.2 g/dL — ABNORMAL LOW (ref 3.5–5.2)
Alkaline Phosphatase: 74 U/L (ref 39–117)
BUN: 18 mg/dL (ref 6–23)
CO2: 28 mEq/L (ref 19–32)
Calcium: 8.7 mg/dL (ref 8.4–10.5)
Chloride: 103 mEq/L (ref 96–112)
Creatinine, Ser: 0.95 mg/dL (ref 0.4–1.2)
GFR calc Af Amer: >60 mL/min (ref >60)
GFR calc non Af Amer: 59 mL/min — ABNORMAL LOW (ref >60)
Glucose, Bld: 97 mg/dL (ref 70–99)
Potassium: 3.2 mEq/L — ABNORMAL LOW (ref 3.5–5.1)
Sodium: 139 mEq/L (ref 135–145)
Total Bilirubin: 0.4 mg/dL (ref 0.3–1.2)
Total Protein: 6.9 g/dL (ref 6.0–8.3)

## 2010-06-08 LAB — CBC
HCT: 32.1 % — ABNORMAL LOW (ref 36.0–46.0)
HCT: 30.7 % — ABNORMAL LOW (ref 36.0–46.0)
HCT: 31.3 % — ABNORMAL LOW (ref 36.0–46.0)
HCT: 32.4 % — ABNORMAL LOW (ref 36.0–46.0)
HCT: 32.9 % — ABNORMAL LOW (ref 36.0–46.0)
HCT: 36 % (ref 36.0–46.0)
Hemoglobin: 11.1 g/dL — ABNORMAL LOW (ref 12.0–15.0)
Hemoglobin: 10.7 g/dL — ABNORMAL LOW (ref 12.0–15.0)
Hemoglobin: 11 g/dL — ABNORMAL LOW (ref 12.0–15.0)
Hemoglobin: 11.3 g/dL — ABNORMAL LOW (ref 12.0–15.0)
Hemoglobin: 11.5 g/dL — ABNORMAL LOW (ref 12.0–15.0)
Hemoglobin: 12.7 g/dL (ref 12.0–15.0)
MCH: 33.1 pg (ref 26.0–34.0)
MCH: 33 pg (ref 26.0–34.0)
MCH: 33.1 pg (ref 26.0–34.0)
MCH: 33.2 pg (ref 26.0–34.0)
MCH: 33.4 pg (ref 26.0–34.0)
MCH: 33.4 pg (ref 26.0–34.0)
MCHC: 34.7 g/dL (ref 30.0–36.0)
MCHC: 34.9 g/dL (ref 30.0–36.0)
MCHC: 35 g/dL (ref 30.0–36.0)
MCHC: 35 g/dL (ref 30.0–36.0)
MCHC: 35.1 g/dL (ref 30.0–36.0)
MCHC: 35.3 g/dL (ref 30.0–36.0)
MCV: 95.4 fL (ref 78.0–100.0)
MCV: 94.2 fL (ref 78.0–100.0)
MCV: 94.5 fL (ref 78.0–100.0)
MCV: 94.6 fL (ref 78.0–100.0)
MCV: 95 fL (ref 78.0–100.0)
MCV: 95.2 fL (ref 78.0–100.0)
Platelets: 452 10*3/uL — ABNORMAL HIGH (ref 150–400)
Platelets: 305 10*3/uL (ref 150–400)
Platelets: 320 10*3/uL (ref 150–400)
Platelets: 327 10*3/uL (ref 150–400)
Platelets: 379 10*3/uL (ref 150–400)
Platelets: 404 10*3/uL — ABNORMAL HIGH (ref 150–400)
RBC: 3.36 MIL/uL — ABNORMAL LOW (ref 3.87–5.11)
RBC: 3.23 MIL/uL — ABNORMAL LOW (ref 3.87–5.11)
RBC: 3.28 MIL/uL — ABNORMAL LOW (ref 3.87–5.11)
RBC: 3.44 MIL/uL — ABNORMAL LOW (ref 3.87–5.11)
RBC: 3.48 MIL/uL — ABNORMAL LOW (ref 3.87–5.11)
RBC: 3.81 MIL/uL — ABNORMAL LOW (ref 3.87–5.11)
RDW: 12.6 % (ref 11.5–15.5)
RDW: 12.5 % (ref 11.5–15.5)
RDW: 12.8 % (ref 11.5–15.5)
RDW: 12.9 % (ref 11.5–15.5)
RDW: 13 % (ref 11.5–15.5)
RDW: 13.1 % (ref 11.5–15.5)
WBC: 7 10*3/uL (ref 4.0–10.5)
WBC: 13.4 10*3/uL — ABNORMAL HIGH (ref 4.0–10.5)
WBC: 14.4 10*3/uL — ABNORMAL HIGH (ref 4.0–10.5)
WBC: 14.9 10*3/uL — ABNORMAL HIGH (ref 4.0–10.5)
WBC: 15.7 10*3/uL — ABNORMAL HIGH (ref 4.0–10.5)
WBC: 15.7 10*3/uL — ABNORMAL HIGH (ref 4.0–10.5)

## 2010-06-08 LAB — URINALYSIS, ROUTINE W REFLEX MICROSCOPIC
Bilirubin Urine: NEGATIVE
Glucose, UA: NEGATIVE mg/dL
Hgb urine dipstick: NEGATIVE
Ketones, ur: NEGATIVE mg/dL
Nitrite: NEGATIVE
Protein, ur: NEGATIVE mg/dL
Specific Gravity, Urine: 1.012 (ref 1.005–1.030)
Urobilinogen, UA: 1 mg/dL (ref 0.0–1.0)
pH: 5.5 (ref 5.0–8.0)

## 2010-06-08 LAB — HEMOCCULT GUIAC POC 1CARD (OFFICE): Fecal Occult Bld: NEGATIVE

## 2010-06-08 LAB — BASIC METABOLIC PANEL
BUN: 10 mg/dL (ref 6–23)
BUN: 13 mg/dL (ref 6–23)
BUN: 6 mg/dL (ref 6–23)
BUN: 8 mg/dL (ref 6–23)
CO2: 24 mEq/L (ref 19–32)
CO2: 25 mEq/L (ref 19–32)
CO2: 28 mEq/L (ref 19–32)
CO2: 29 mEq/L (ref 19–32)
Calcium: 7.8 mg/dL — ABNORMAL LOW (ref 8.4–10.5)
Calcium: 8 mg/dL — ABNORMAL LOW (ref 8.4–10.5)
Calcium: 8.2 mg/dL — ABNORMAL LOW (ref 8.4–10.5)
Calcium: 8.3 mg/dL — ABNORMAL LOW (ref 8.4–10.5)
Chloride: 104 mEq/L (ref 96–112)
Chloride: 105 mEq/L (ref 96–112)
Chloride: 105 mEq/L (ref 96–112)
Chloride: 107 mEq/L (ref 96–112)
Creatinine, Ser: 0.78 mg/dL (ref 0.4–1.2)
Creatinine, Ser: 0.83 mg/dL (ref 0.4–1.2)
Creatinine, Ser: 0.92 mg/dL (ref 0.4–1.2)
Creatinine, Ser: 1.04 mg/dL (ref 0.4–1.2)
GFR calc Af Amer: >60 mL/min (ref >60)
GFR calc Af Amer: >60 mL/min (ref >60)
GFR calc Af Amer: >60 mL/min (ref >60)
GFR calc Af Amer: >60 mL/min (ref >60)
GFR calc non Af Amer: 53 mL/min — ABNORMAL LOW (ref >60)
GFR calc non Af Amer: >60 mL/min (ref >60)
GFR calc non Af Amer: >60 mL/min (ref >60)
GFR calc non Af Amer: >60 mL/min (ref >60)
Glucose, Bld: 111 mg/dL — ABNORMAL HIGH (ref 70–99)
Glucose, Bld: 117 mg/dL — ABNORMAL HIGH (ref 70–99)
Glucose, Bld: 86 mg/dL (ref 70–99)
Glucose, Bld: 96 mg/dL (ref 70–99)
Potassium: 4 mEq/L (ref 3.5–5.1)
Potassium: 4.1 mEq/L (ref 3.5–5.1)
Potassium: 4.1 mEq/L (ref 3.5–5.1)
Potassium: 4.2 mEq/L (ref 3.5–5.1)
Sodium: 136 mEq/L (ref 135–145)
Sodium: 138 mEq/L (ref 135–145)
Sodium: 139 mEq/L (ref 135–145)
Sodium: 140 mEq/L (ref 135–145)

## 2010-06-08 LAB — DIFFERENTIAL
Basophils Absolute: 0 10*3/uL (ref 0.0–0.1)
Basophils Absolute: 0 10*3/uL (ref 0.0–0.1)
Basophils Absolute: 0.1 10*3/uL (ref 0.0–0.1)
Basophils Absolute: 0.1 10*3/uL (ref 0.0–0.1)
Basophils Relative: 0 % (ref 0–1)
Basophils Relative: 0 % (ref 0–1)
Basophils Relative: 0 % (ref 0–1)
Basophils Relative: 1 % (ref 0–1)
Eosinophils Absolute: 0 10*3/uL (ref 0.0–0.7)
Eosinophils Absolute: 0 10*3/uL (ref 0.0–0.7)
Eosinophils Absolute: 0 10*3/uL (ref 0.0–0.7)
Eosinophils Absolute: 0.1 10*3/uL (ref 0.0–0.7)
Eosinophils Relative: 0 % (ref 0–5)
Eosinophils Relative: 0 % (ref 0–5)
Eosinophils Relative: 0 % (ref 0–5)
Eosinophils Relative: 1 % (ref 0–5)
Lymphocytes Relative: 12 % (ref 12–46)
Lymphocytes Relative: 13 % (ref 12–46)
Lymphocytes Relative: 14 % (ref 12–46)
Lymphocytes Relative: 18 % (ref 12–46)
Lymphs Abs: 1.5 10*3/uL (ref 0.7–4.0)
Lymphs Abs: 2 10*3/uL (ref 0.7–4.0)
Lymphs Abs: 2.1 10*3/uL (ref 0.7–4.0)
Lymphs Abs: 2.7 10*3/uL (ref 0.7–4.0)
Monocytes Absolute: 1 10*3/uL (ref 0.1–1.0)
Monocytes Absolute: 1.1 10*3/uL — ABNORMAL HIGH (ref 0.1–1.0)
Monocytes Absolute: 1.2 10*3/uL — ABNORMAL HIGH (ref 0.1–1.0)
Monocytes Absolute: 1.3 10*3/uL — ABNORMAL HIGH (ref 0.1–1.0)
Monocytes Relative: 6 % (ref 3–12)
Monocytes Relative: 8 % (ref 3–12)
Monocytes Relative: 9 % (ref 3–12)
Monocytes Relative: 9 % (ref 3–12)
Neutro Abs: 10.5 10*3/uL — ABNORMAL HIGH (ref 1.7–7.7)
Neutro Abs: 10.9 10*3/uL — ABNORMAL HIGH (ref 1.7–7.7)
Neutro Abs: 11.2 10*3/uL — ABNORMAL HIGH (ref 1.7–7.7)
Neutro Abs: 12.6 10*3/uL — ABNORMAL HIGH (ref 1.7–7.7)
Neutrophils Relative %: 73 % (ref 43–77)
Neutrophils Relative %: 78 % — ABNORMAL HIGH (ref 43–77)
Neutrophils Relative %: 79 % — ABNORMAL HIGH (ref 43–77)
Neutrophils Relative %: 80 % — ABNORMAL HIGH (ref 43–77)

## 2010-06-08 LAB — URINE MICROSCOPIC-ADD ON

## 2010-06-08 LAB — TSH: TSH: 0.891 u[IU]/mL (ref 0.350–4.500)

## 2010-06-08 LAB — LIPASE, BLOOD: Lipase: 35 U/L (ref 11–59)

## 2010-06-10 LAB — BASIC METABOLIC PANEL
BUN: 14 mg/dL (ref 6–23)
BUN: 15 mg/dL (ref 6–23)
CO2: 29 mEq/L (ref 19–32)
CO2: 28 mEq/L (ref 19–32)
Calcium: 9.1 mg/dL (ref 8.4–10.5)
Calcium: 9.2 mg/dL (ref 8.4–10.5)
Chloride: 103 mEq/L (ref 96–112)
Chloride: 103 mEq/L (ref 96–112)
Creatinine, Ser: 0.87 mg/dL (ref 0.4–1.2)
Creatinine, Ser: 0.77 mg/dL (ref 0.4–1.2)
GFR calc Af Amer: >60 mL/min (ref >60)
GFR calc Af Amer: >60 mL/min (ref >60)
GFR calc non Af Amer: >60 mL/min (ref >60)
GFR calc non Af Amer: >60 mL/min (ref >60)
Glucose, Bld: 104 mg/dL — ABNORMAL HIGH (ref 70–99)
Glucose, Bld: 100 mg/dL — ABNORMAL HIGH (ref 70–99)
Potassium: 4.3 mEq/L (ref 3.5–5.1)
Potassium: 4.4 mEq/L (ref 3.5–5.1)
Sodium: 138 mEq/L (ref 135–145)
Sodium: 137 mEq/L (ref 135–145)

## 2010-06-10 LAB — CBC
HCT: 40.7 % (ref 36.0–46.0)
HCT: 42.7 % (ref 36.0–46.0)
Hemoglobin: 14.4 g/dL (ref 12.0–15.0)
Hemoglobin: 15 g/dL (ref 12.0–15.0)
MCHC: 35.2 g/dL (ref 30.0–36.0)
MCHC: 35.1 g/dL (ref 30.0–36.0)
MCV: 95.3 fL (ref 78.0–100.0)
MCV: 95.6 fL (ref 78.0–100.0)
Platelets: 325 10*3/uL (ref 150–400)
Platelets: 295 10*3/uL (ref 150–400)
RBC: 4.28 MIL/uL (ref 3.87–5.11)
RBC: 4.47 MIL/uL (ref 3.87–5.11)
RDW: 12.7 % (ref 11.5–15.5)
RDW: 12.4 % (ref 11.5–15.5)
WBC: 11.9 10*3/uL — ABNORMAL HIGH (ref 4.0–10.5)
WBC: 9.1 10*3/uL (ref 4.0–10.5)

## 2010-06-10 LAB — PROTIME-INR
INR: 0.94 (ref 0.00–1.49)
Prothrombin Time: 12.5 seconds (ref 11.6–15.2)

## 2010-06-10 LAB — APTT: aPTT: 28 seconds (ref 24–37)

## 2010-06-11 LAB — GLUCOSE, CAPILLARY: Glucose-Capillary: 109 mg/dL — ABNORMAL HIGH (ref 70–99)

## 2010-06-18 ENCOUNTER — Ambulatory Visit
Admission: RE | Admit: 2010-06-18 | Discharge: 2010-06-18 | Disposition: A | Discharge status: Home / Self Care | Payer: MEDICARE | Source: Ambulatory Visit | Attending: Oncology | Admitting: Oncology

## 2010-06-18 DIAGNOSIS — Z9889 Other specified postprocedural states: Secondary | ICD-10-CM

## 2010-07-22 ENCOUNTER — Other Ambulatory Visit: Payer: Self-pay | Admitting: Oncology

## 2010-07-22 ENCOUNTER — Encounter (HOSPITAL_BASED_OUTPATIENT_CLINIC_OR_DEPARTMENT_OTHER): Payer: MEDICARE | Admitting: Oncology

## 2010-07-22 DIAGNOSIS — D0590 Unspecified type of carcinoma in situ of unspecified breast: Secondary | ICD-10-CM

## 2010-07-22 DIAGNOSIS — C50419 Malignant neoplasm of upper-outer quadrant of unspecified female breast: Secondary | ICD-10-CM

## 2010-07-22 DIAGNOSIS — Z170 Estrogen receptor positive status [ER+]: Secondary | ICD-10-CM

## 2010-07-22 LAB — CBC WITH DIFFERENTIAL/PLATELET
BASO%: 0.9 % (ref 0.0–2.0)
Basophils Absolute: 0.1 10*3/uL (ref 0.0–0.1)
EOS%: 6.7 % (ref 0.0–7.0)
Eosinophils Absolute: 0.6 10*3/uL — ABNORMAL HIGH (ref 0.0–0.5)
HCT: 41.3 % (ref 34.8–46.6)
HGB: 14.5 g/dL (ref 11.6–15.9)
LYMPH%: 30.9 % (ref 14.0–49.7)
MCH: 32.9 pg (ref 25.1–34.0)
MCHC: 35.1 g/dL (ref 31.5–36.0)
MCV: 93.8 fL (ref 79.5–101.0)
MONO#: 0.7 10*3/uL (ref 0.1–0.9)
MONO%: 8.2 % (ref 0.0–14.0)
NEUT#: 4.6 10*3/uL (ref 1.5–6.5)
NEUT%: 53.3 % (ref 38.4–76.8)
Platelets: 290 10*3/uL (ref 145–400)
RBC: 4.4 10*6/uL (ref 3.70–5.45)
RDW: 12.2 % (ref 11.2–14.5)
WBC: 8.7 10*3/uL (ref 3.9–10.3)
lymph#: 2.7 10*3/uL (ref 0.9–3.3)

## 2010-07-23 LAB — COMPREHENSIVE METABOLIC PANEL
ALT: 12 U/L (ref 0–35)
AST: 16 U/L (ref 0–37)
Albumin: 4 g/dL (ref 3.5–5.2)
Alkaline Phosphatase: 78 U/L (ref 39–117)
BUN: 18 mg/dL (ref 6–23)
CO2: 23 mEq/L (ref 19–32)
Calcium: 9.7 mg/dL (ref 8.4–10.5)
Chloride: 101 mEq/L (ref 96–112)
Creatinine, Ser: 0.86 mg/dL (ref 0.40–1.20)
Glucose, Bld: 93 mg/dL (ref 70–99)
Potassium: 4 mEq/L (ref 3.5–5.3)
Sodium: 138 mEq/L (ref 135–145)
Total Bilirubin: 0.6 mg/dL (ref 0.3–1.2)
Total Protein: 6.8 g/dL (ref 6.0–8.3)

## 2010-07-23 LAB — VITAMIN D 25 HYDROXY (VIT D DEFICIENCY, FRACTURES): Vit D, 25-Hydroxy: 61 ng/mL (ref 30–89)

## 2010-07-23 LAB — CANCER ANTIGEN 27.29: CA 27.29: 16 U/mL (ref 0–39)

## 2010-08-05 NOTE — Op Note (Signed)
Sarah Burgess, Sarah Burgess              ACCOUNT NO.:  [id number]   MEDICAL RECORD NO.:  [id number]          PATIENT TYPE:  AMB   LOCATION:  SDC                           FACILITY:  [facility]   PHYSICIAN:  Charles A. Delcambre, MDDATE OF BIRTH:  Jan 15, 1941   DATE OF PROCEDURE:  04/11/2007  DATE OF DISCHARGE:                               OPERATIVE REPORT   PREOPERATIVE DIAGNOSIS:  Postmenopausal bleeding.   POSTOPERATIVE DIAGNOSIS:  Postmenopausal bleeding, asystole (brief  returned with Robinul).   PROCEDURE:  Dilation curettage.   SURGEON:  Charles A. Delcambre, MD   ASSISTANT:  None.   COMPLICATIONS:  Asystole as noted above.  See anesthesia notes.   ANESTHESIA:  General by the laryngeal route.   OPERATIVE FINDINGS:  Minimal tissue on curettage.   SPECIMEN:  Endometrial curettings to pathology.   ESTIMATED BLOOD LOSS:  Less than 10 mL.   COUNTS:  Correct.   DESCRIPTION OF PROCEDURE:  The patient was taken to the operating room,  placed in supine position.  General anesthetic was induced without  difficulty.  She was placed in dorsal lithotomy position at that time  and Graves speculum was placed in the vagina,  Tenaculum was used on the  anterior lip of the cervix.  Sound was to 7 cm.  Hanks dilators were  used enough to pass a small banjo curette.  There was no evidence of  perforation.  During generalized curettage, the patient had a systolic  episode lasting several seconds to a heart rate of approximately 18 and  gradually came back with Robinul per the anesthetist.  She returned to  normal sinus rhythm and a normal right thereafter.  The procedure was  terminated.  Hemostasis was adequate.  Tenaculum site was good  hemostasis.  The  patient tolerated procedure well other than the asystolic episode which  was felt to be likely of vagal reaction to the curettage and was taken  to recovery.  She will be monitored in recovery for several hours before  decision made to allow  her to go home.      Charles A. Sydnee Cabal, MD  Electronically Signed     CAD/MEDQ  D:  04/11/2007  T:  04/12/2007  Job:  [id number]   cc:   Georga Hacking, M.D.  Fax: [phone]  Email: [email or web address]

## 2010-08-25 ENCOUNTER — Encounter (INDEPENDENT_AMBULATORY_CARE_PROVIDER_SITE_OTHER): Payer: Self-pay | Admitting: Surgery

## 2010-09-02 ENCOUNTER — Encounter (HOSPITAL_BASED_OUTPATIENT_CLINIC_OR_DEPARTMENT_OTHER): Payer: Medicare Other | Admitting: Oncology

## 2010-09-02 DIAGNOSIS — C50419 Malignant neoplasm of upper-outer quadrant of unspecified female breast: Secondary | ICD-10-CM

## 2010-09-02 DIAGNOSIS — Z452 Encounter for adjustment and management of vascular access device: Secondary | ICD-10-CM

## 2010-10-14 ENCOUNTER — Encounter (HOSPITAL_BASED_OUTPATIENT_CLINIC_OR_DEPARTMENT_OTHER): Payer: Medicare Other | Admitting: Oncology

## 2010-10-14 DIAGNOSIS — C50419 Malignant neoplasm of upper-outer quadrant of unspecified female breast: Secondary | ICD-10-CM

## 2010-10-14 DIAGNOSIS — Z452 Encounter for adjustment and management of vascular access device: Secondary | ICD-10-CM

## 2010-10-23 ENCOUNTER — Ambulatory Visit
Admission: RE | Admit: 2010-10-23 | Discharge: 2010-10-23 | Disposition: A | Discharge status: Home / Self Care | Payer: Medicare Other | Source: Ambulatory Visit | Attending: Radiation Oncology | Admitting: Radiation Oncology

## 2010-11-07 ENCOUNTER — Other Ambulatory Visit (INDEPENDENT_AMBULATORY_CARE_PROVIDER_SITE_OTHER): Payer: Self-pay | Admitting: Surgery

## 2010-11-07 ENCOUNTER — Ambulatory Visit (INDEPENDENT_AMBULATORY_CARE_PROVIDER_SITE_OTHER): Payer: Medicare Other | Admitting: Surgery

## 2010-11-07 ENCOUNTER — Ambulatory Visit
Admission: RE | Admit: 2010-11-07 | Discharge: 2010-11-07 | Disposition: A | Discharge status: Home / Self Care | Payer: Medicare Other | Source: Ambulatory Visit | Attending: Surgery | Admitting: Surgery

## 2010-11-07 ENCOUNTER — Encounter (INDEPENDENT_AMBULATORY_CARE_PROVIDER_SITE_OTHER): Payer: Self-pay | Admitting: Surgery

## 2010-11-07 VITALS — BP 120/80 | HR 80 | Temp 97.7°F

## 2010-11-07 DIAGNOSIS — R2230 Localized swelling, mass and lump, unspecified upper limb: Secondary | ICD-10-CM

## 2010-11-07 DIAGNOSIS — R229 Localized swelling, mass and lump, unspecified: Secondary | ICD-10-CM

## 2010-11-07 DIAGNOSIS — Z853 Personal history of malignant neoplasm of breast: Secondary | ICD-10-CM

## 2010-11-07 NOTE — Patient Instructions (Signed)
We will obtain an ultrasound today and you may need a biopsy.

## 2010-11-07 NOTE — Progress Notes (Signed)
11/07/2010  Sarah Burgess is a 71 y.o.female who presents for routine followup of her Left breas cancerdiagnosed in 2011 and treated with Lumpectomy, radiation, chemo. She has some tenderness in the left axilla and a lump that has recently developed  [facility] She has had no significant changes since the last visit here.  ROS There have been no significant changes since the last visit here  General: The patient is alert, oriented, generally healty appearing, NAD. Mood and affect are normal.  Breasts: The right breast is totally normal. The left breast has significant deformity secondary to multiple lumpectomies and radiation. There is no mass, but there is tenderness. The skin shows radiation changes Lymphatics: The right axillary and bilateral supraclavicular areas are normal. There is a 2 cm hard mass in the left axillary area just below her sentinel node incision. It is tender. Clinically, is worrisome for metastatic disease.e has no axillary or supraclavicular adenopathy on either side.  Extremities: Full ROM of the surgical side with no lymphedema noted.  Data Reviewed: Mammogram in March was negaitve  Impression: No evidence of recurrent breast cancer in the left breast or problems with the right breast.. Suspicious area in the left axilla for metastatic disease.  Plan: She will go up to the breast Center today for an ultrasound of the left axilla and possible biopsy. I spoke to Dr. Jean Rosenthal about it. We'll followup with her once we have this evaluation done. I told her that if the biopsy is necessary we would not have a report before Monday.  After the above note was done, she had an ultrasound and repeat mammogram which showed only benign tissue. There is nothing suspicious for biopsy.

## 2010-11-12 ENCOUNTER — Inpatient Hospital Stay: Admission: RE | Admit: 2010-11-12 | Payer: Medicare Other | Source: Ambulatory Visit

## 2010-11-25 ENCOUNTER — Encounter (HOSPITAL_BASED_OUTPATIENT_CLINIC_OR_DEPARTMENT_OTHER): Payer: Medicare Other | Admitting: Oncology

## 2010-11-25 DIAGNOSIS — C50419 Malignant neoplasm of upper-outer quadrant of unspecified female breast: Secondary | ICD-10-CM

## 2010-11-25 DIAGNOSIS — Z452 Encounter for adjustment and management of vascular access device: Secondary | ICD-10-CM

## 2011-01-06 ENCOUNTER — Other Ambulatory Visit: Payer: Self-pay | Admitting: Oncology

## 2011-01-06 ENCOUNTER — Encounter (HOSPITAL_BASED_OUTPATIENT_CLINIC_OR_DEPARTMENT_OTHER): Payer: Medicare Other | Admitting: Oncology

## 2011-01-06 DIAGNOSIS — C50419 Malignant neoplasm of upper-outer quadrant of unspecified female breast: Secondary | ICD-10-CM

## 2011-01-06 DIAGNOSIS — Z170 Estrogen receptor positive status [ER+]: Secondary | ICD-10-CM

## 2011-01-06 DIAGNOSIS — Z452 Encounter for adjustment and management of vascular access device: Secondary | ICD-10-CM

## 2011-01-06 DIAGNOSIS — D0590 Unspecified type of carcinoma in situ of unspecified breast: Secondary | ICD-10-CM

## 2011-01-06 LAB — CBC WITH DIFFERENTIAL/PLATELET
BASO%: 1 % (ref 0.0–2.0)
Basophils Absolute: 0.1 10*3/uL (ref 0.0–0.1)
EOS%: 4.9 % (ref 0.0–7.0)
Eosinophils Absolute: 0.4 10*3/uL (ref 0.0–0.5)
HCT: 42.4 % (ref 34.8–46.6)
HGB: 14.6 g/dL (ref 11.6–15.9)
LYMPH%: 29.3 % (ref 14.0–49.7)
MCH: 32.8 pg (ref 25.1–34.0)
MCHC: 34.6 g/dL (ref 31.5–36.0)
MCV: 94.9 fL (ref 79.5–101.0)
MONO#: 0.5 10*3/uL (ref 0.1–0.9)
MONO%: 6.3 % (ref 0.0–14.0)
NEUT#: 4.5 10*3/uL (ref 1.5–6.5)
NEUT%: 58.5 % (ref 38.4–76.8)
Platelets: 301 10*3/uL (ref 145–400)
RBC: 4.47 10*6/uL (ref 3.70–5.45)
RDW: 12.6 % (ref 11.2–14.5)
WBC: 7.7 10*3/uL (ref 3.9–10.3)
lymph#: 2.3 10*3/uL (ref 0.9–3.3)

## 2011-01-22 ENCOUNTER — Encounter (HOSPITAL_BASED_OUTPATIENT_CLINIC_OR_DEPARTMENT_OTHER): Payer: Medicare Other | Admitting: Oncology

## 2011-01-22 ENCOUNTER — Other Ambulatory Visit: Payer: Self-pay | Admitting: Oncology

## 2011-01-22 ENCOUNTER — Telehealth: Payer: Self-pay | Admitting: *Deleted

## 2011-01-22 DIAGNOSIS — C50419 Malignant neoplasm of upper-outer quadrant of unspecified female breast: Secondary | ICD-10-CM

## 2011-01-22 DIAGNOSIS — Z452 Encounter for adjustment and management of vascular access device: Secondary | ICD-10-CM

## 2011-01-23 ENCOUNTER — Other Ambulatory Visit: Payer: Self-pay | Admitting: Oncology

## 2011-01-23 DIAGNOSIS — Z853 Personal history of malignant neoplasm of breast: Secondary | ICD-10-CM

## 2011-01-23 LAB — COMPREHENSIVE METABOLIC PANEL
ALT: 11 U/L (ref 0–35)
AST: 16 U/L (ref 0–37)
Albumin: 4.2 g/dL (ref 3.5–5.2)
Alkaline Phosphatase: 87 U/L (ref 39–117)
BUN: 15 mg/dL (ref 6–23)
CO2: 25 mEq/L (ref 19–32)
Calcium: 9.7 mg/dL (ref 8.4–10.5)
Chloride: 102 mEq/L (ref 96–112)
Creatinine, Ser: 0.89 mg/dL (ref 0.50–1.10)
Glucose, Bld: 104 mg/dL — ABNORMAL HIGH (ref 70–99)
Potassium: 4.2 mEq/L (ref 3.5–5.3)
Sodium: 139 mEq/L (ref 135–145)
Total Bilirubin: 0.5 mg/dL (ref 0.3–1.2)
Total Protein: 7 g/dL (ref 6.0–8.3)

## 2011-01-23 LAB — VITAMIN D 25 HYDROXY (VIT D DEFICIENCY, FRACTURES): Vit D, 25-Hydroxy: 56 ng/mL (ref 30–89)

## 2011-01-23 LAB — CANCER ANTIGEN 27.29: CA 27.29: 14 U/mL (ref 0–39)

## 2011-01-23 LAB — LACTATE DEHYDROGENASE: LDH: 181 U/L (ref 94–250)

## 2011-02-17 NOTE — Progress Notes (Signed)
CC:   Sarah Burgess, M.D.  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  INTERVAL HISTORY:  Sarah Burgess returns for followup.  She is doing pretty well.  She tolerates the Arimidex fairly well.  She has occasional hot flashes.  There is no real bone or joint pain.  Appetite is good. Weight is stable.  She is taking additional calcium and vitamin D.  Review of medications revealed no new changes.  ECOG status is zero.  PHYSICAL EXAMINATION:  General Appearance:  A pleasant, alert woman looking stated age.  Vital Signs:  Blood pressure is 136/80. Temperature 97.9.  Pulse 66.  Respiratory rate 20.  Weight is 249.7. Head and Neck Exam:  No palpable adenopathy in the head and neck area. Oropharynx is normal.  Lungs:  Clear.  Heart:  Sounds are normal. Breast Exam:  Breasts are free of any masses.  Both axillae are negative.  Right breast is normal.  Left breast is status post lumpectomy and radiation.  Radiation changes are still present, but are resolving.  There is some redness throughout the breast, but no nipple retraction or skin changes.  Abdomen:  Soft.  No palpable hepatosplenomegaly.  No inguinal adenopathy.  Extremities:  No peripheral cyanosis, clubbing, or edema.  IMPRESSION AND PLAN:  Sarah Burgess is doing well.  No clinical evidence of recurrence.  CBC and chemistries are reviewed.  Tumor marker is normal and vitamin D level is excellent at 56.  I will see her again in followup in 6 months' time with appropriate imaging studies.    ______________________________ Pierce Crane, M.D., F.R.C.P.C. PR/MEDQ  D:  02/17/2011  T:  02/17/2011  Job:  [id number]

## 2011-02-19 ENCOUNTER — Ambulatory Visit (INDEPENDENT_AMBULATORY_CARE_PROVIDER_SITE_OTHER): Payer: Medicare Other | Admitting: Surgery

## 2011-02-19 ENCOUNTER — Encounter (INDEPENDENT_AMBULATORY_CARE_PROVIDER_SITE_OTHER): Payer: Self-pay | Admitting: Surgery

## 2011-02-19 VITALS — BP 132/84 | HR 80 | Temp 98.1°F | Resp 20 | Ht 64.0 in | Wt 251.8 lb

## 2011-02-19 DIAGNOSIS — Z853 Personal history of malignant neoplasm of breast: Secondary | ICD-10-CM

## 2011-02-19 NOTE — Progress Notes (Signed)
02/19/2011  Sarah Burgess is a 70 y.o.female who presents for routine followup of her Left breast cancer diagnosed in 2011 and treated with Lumpectomy, radiation, chemo. She has some tenderness in the left axilla and a lump that was noted on her last visit here. Dr Donnie Coffin has told her she can have her port removed PFSH She has had no significant changes since the last visit here.  ROS There have been no significant changes since the last visit here  General: The patient is alert, oriented, generally healty appearing, NAD. Mood and affect are normal.  Breasts: The right breast is totally normal. The left breast has significant deformity secondary to multiple lumpectomies and radiation. There is no mass, but there is tenderness. The skin shows radiation changes Lymphatics: The right axillary and bilateral supraclavicular areas are normal. There is a 2 cm hard mass in the left axillary area just below her sentinel node incision. It is tender. This is not changed from August  Extremities: Full ROM of the surgical side with no lymphedema noted.  Data Reviewed: Mammogram and sono in August negative. The area in the left axilla appears to be fat necrosis  Impression: No evidence of recurrent breast cancer in the left breast or problems with the right breast.. Suspicious area in the left axilla for metastatic disease.  Plan: She will go up to the breast Center today for an ultrasound of the left axilla and possible biopsy. I spoke to Dr. Jean Rosenthal about it. We'll followup with her once we have this evaluation done. I told her that if the biopsy is necessary we would not have a report before Monday.  After the above note was done, she had an ultrasound and repeat mammogram which showed only benign tissue. There is nothing suspicious for biopsy.

## 2011-02-19 NOTE — Patient Instructions (Signed)
We will schedule you to have your port removed. It will be done as an outpatient with local anesthesia

## 2011-02-23 ENCOUNTER — Encounter (HOSPITAL_BASED_OUTPATIENT_CLINIC_OR_DEPARTMENT_OTHER)
Admission: RE | Disposition: A | Discharge status: Home / Self Care | Payer: Self-pay | Source: Ambulatory Visit | Attending: Surgery

## 2011-02-23 ENCOUNTER — Ambulatory Visit (HOSPITAL_BASED_OUTPATIENT_CLINIC_OR_DEPARTMENT_OTHER)
Admission: RE | Admit: 2011-02-23 | Discharge: 2011-02-23 | Disposition: A | Discharge status: Home / Self Care | Payer: Medicare Other | Source: Ambulatory Visit | Attending: Surgery | Admitting: Surgery

## 2011-02-23 DIAGNOSIS — Z452 Encounter for adjustment and management of vascular access device: Secondary | ICD-10-CM

## 2011-02-23 DIAGNOSIS — Z853 Personal history of malignant neoplasm of breast: Secondary | ICD-10-CM

## 2011-02-23 HISTORY — PX: PORT-A-CATH REMOVAL: SHX5289

## 2011-02-23 SURGERY — REMOVAL PORT-A-CATH
Anesthesia: LOCAL | Site: Chest | Wound class: Clean

## 2011-02-23 MED ORDER — LIDOCAINE-EPINEPHRINE (PF) 1 %-1:200000 IJ SOLN
INTRAMUSCULAR | Status: DC | PRN
  Administered 2011-02-23: 10 mL

## 2011-02-23 SURGICAL SUPPLY — 27 items
BLADE SURG 15 STRL LF DISP TIS (BLADE) ×1 IMPLANT
BLADE SURG 15 STRL SS (BLADE) ×1
CHLORAPREP W/TINT 26ML (MISCELLANEOUS) ×2 IMPLANT
CLOTH BEACON ORANGE TIMEOUT ST (SAFETY) ×2 IMPLANT
COVER MAYO STAND STRL (DRAPES) IMPLANT
COVER TABLE BACK 60X90 (DRAPES) IMPLANT
DECANTER SPIKE VIAL GLASS SM (MISCELLANEOUS) IMPLANT
DERMABOND ADVANCED (GAUZE/BANDAGES/DRESSINGS) ×1
DERMABOND ADVANCED .7 DNX12 (GAUZE/BANDAGES/DRESSINGS) ×1 IMPLANT
DRAPE PED LAPAROTOMY (DRAPES) IMPLANT
DRAPE UTILITY XL STRL (DRAPES) IMPLANT
ELECT REM PT RETURN 9FT ADLT (ELECTROSURGICAL)
ELECTRODE REM PT RTRN 9FT ADLT (ELECTROSURGICAL) IMPLANT
GAUZE SPONGE 4X4 12PLY STRL LF (GAUZE/BANDAGES/DRESSINGS) IMPLANT
GLOVE BIO SURGEON STRL SZ7 (GLOVE) ×2 IMPLANT
GLOVE EUDERMIC 7 POWDERFREE (GLOVE) ×2 IMPLANT
GOWN PREVENTION PLUS XLARGE (GOWN DISPOSABLE) IMPLANT
NEEDLE HYPO 25X1 1.5 SAFETY (NEEDLE) ×2 IMPLANT
PACK BASIN DAY SURGERY FS (CUSTOM PROCEDURE TRAY) IMPLANT
PENCIL BUTTON HOLSTER BLD 10FT (ELECTRODE) IMPLANT
SLEEVE SCD COMPRESS KNEE MED (MISCELLANEOUS) IMPLANT
SUT MON AB 4-0 PC3 18 (SUTURE) ×2 IMPLANT
SUT VICRYL 3-0 CR8 SH (SUTURE) ×2 IMPLANT
SYR CONTROL 10ML LL (SYRINGE) ×2 IMPLANT
TOWEL OR 17X24 6PK STRL BLUE (TOWEL DISPOSABLE) IMPLANT
TOWEL OR NON WOVEN STRL DISP B (DISPOSABLE) IMPLANT
WATER STERILE IRR 1000ML POUR (IV SOLUTION) IMPLANT

## 2011-02-23 NOTE — Op Note (Signed)
Sarah Burgess Gripp 04-09-1940 [id number] 02/19/2011  Preoperative diagnosis: Un-Needed PAC  Postoperative diagnosis: Same  Procedure: Portacath Removal  Surgeon: Currie Paris, MD, FACS  Anesthesia: Local  Clinical History and Indications: The patient has finished her chemotherapy and no longer needs a port. She wishes to have it removed.  Procedure: The patient was seen in the preoperative area and we confirmed the plans for the procedure as noted above. The Port-A-Cath site was identified and marked. The patient had no further questions.  The patient was then taken into the procedure room. The timeout was done. The area over the Port-A-Cath was anesthetized with 1% Xylocaine with epinephrine. I waited about 10 minutes and then the area was prepped and draped.  The old scar was opened. The capsule around the port opened and the port identified. The holding sutures were cut. The catheter was backed partially out of its tract. A figure 8 3-0 Vicryl suture was placed, the tubing removed, and the suture tied down to prevent backbleeding.  The port was then removed from its pocket. I made sure everything was dry. The incision was closed with 3-0 Vicryl and Dermabond.  The patient tolerated the procedure well. There were no complications. Currie Paris, MD, FACS 02/23/2011 8:21 AM

## 2011-02-24 ENCOUNTER — Encounter (HOSPITAL_BASED_OUTPATIENT_CLINIC_OR_DEPARTMENT_OTHER): Payer: Self-pay

## 2011-02-27 ENCOUNTER — Encounter (HOSPITAL_BASED_OUTPATIENT_CLINIC_OR_DEPARTMENT_OTHER): Payer: Self-pay | Admitting: Surgery

## 2011-05-22 ENCOUNTER — Other Ambulatory Visit: Payer: Self-pay | Admitting: *Deleted

## 2011-05-22 DIAGNOSIS — C50419 Malignant neoplasm of upper-outer quadrant of unspecified female breast: Secondary | ICD-10-CM

## 2011-05-22 MED ORDER — ANASTROZOLE 1 MG PO TABS: 1.0000 mg | ORAL_TABLET | Freq: Every day | ORAL | Status: DC

## 2011-06-18 ENCOUNTER — Other Ambulatory Visit (HOSPITAL_BASED_OUTPATIENT_CLINIC_OR_DEPARTMENT_OTHER): Payer: Medicare Other | Admitting: Lab

## 2011-06-18 DIAGNOSIS — Z452 Encounter for adjustment and management of vascular access device: Secondary | ICD-10-CM

## 2011-06-18 DIAGNOSIS — C50419 Malignant neoplasm of upper-outer quadrant of unspecified female breast: Secondary | ICD-10-CM

## 2011-06-18 LAB — CBC WITH DIFFERENTIAL/PLATELET
BASO%: 0.4 % (ref 0.0–2.0)
Basophils Absolute: 0 10*3/uL (ref 0.0–0.1)
EOS%: 5 % (ref 0.0–7.0)
Eosinophils Absolute: 0.5 10*3/uL (ref 0.0–0.5)
HCT: 43.2 % (ref 34.8–46.6)
HGB: 14.8 g/dL (ref 11.6–15.9)
LYMPH%: 30.7 % (ref 14.0–49.7)
MCH: 31.6 pg (ref 25.1–34.0)
MCHC: 34.3 g/dL (ref 31.5–36.0)
MCV: 92.3 fL (ref 79.5–101.0)
MONO#: 0.7 10*3/uL (ref 0.1–0.9)
MONO%: 7.8 % (ref 0.0–14.0)
NEUT#: 5.3 10*3/uL (ref 1.5–6.5)
NEUT%: 56.1 % (ref 38.4–76.8)
Platelets: 297 10*3/uL (ref 145–400)
RBC: 4.68 10*6/uL (ref 3.70–5.45)
RDW: 12.7 % (ref 11.2–14.5)
WBC: 9.4 10*3/uL (ref 3.9–10.3)
lymph#: 2.9 10*3/uL (ref 0.9–3.3)

## 2011-06-19 LAB — COMPREHENSIVE METABOLIC PANEL
ALT: 13 U/L (ref 0–35)
AST: 16 U/L (ref 0–37)
Albumin: 4.1 g/dL (ref 3.5–5.2)
Alkaline Phosphatase: 93 U/L (ref 39–117)
BUN: 21 mg/dL (ref 6–23)
CO2: 25 mEq/L (ref 19–32)
Calcium: 9.6 mg/dL (ref 8.4–10.5)
Chloride: 100 mEq/L (ref 96–112)
Creatinine, Ser: 0.95 mg/dL (ref 0.50–1.10)
Glucose, Bld: 115 mg/dL — ABNORMAL HIGH (ref 70–99)
Potassium: 4.3 mEq/L (ref 3.5–5.3)
Sodium: 138 mEq/L (ref 135–145)
Total Bilirubin: 0.5 mg/dL (ref 0.3–1.2)
Total Protein: 7.1 g/dL (ref 6.0–8.3)

## 2011-06-19 LAB — VITAMIN D 25 HYDROXY (VIT D DEFICIENCY, FRACTURES): Vit D, 25-Hydroxy: 55 ng/mL (ref 30–89)

## 2011-06-23 ENCOUNTER — Ambulatory Visit: Payer: Medicare Other | Admitting: Oncology

## 2011-06-25 ENCOUNTER — Ambulatory Visit (HOSPITAL_BASED_OUTPATIENT_CLINIC_OR_DEPARTMENT_OTHER): Payer: Medicare Other | Admitting: Oncology

## 2011-06-25 ENCOUNTER — Telehealth: Payer: Self-pay | Admitting: Oncology

## 2011-06-25 VITALS — BP 133/69 | HR 77 | Temp 98.0°F | Ht 64.0 in | Wt 253.3 lb

## 2011-06-25 DIAGNOSIS — C50419 Malignant neoplasm of upper-outer quadrant of unspecified female breast: Secondary | ICD-10-CM

## 2011-06-25 DIAGNOSIS — E559 Vitamin D deficiency, unspecified: Secondary | ICD-10-CM

## 2011-06-25 DIAGNOSIS — Z853 Personal history of malignant neoplasm of breast: Secondary | ICD-10-CM

## 2011-06-25 NOTE — Telephone Encounter (Signed)
Gv pt appt for oct2013.  pt was scheduled for mammogram and bone density fir 05/20 @ [facility]

## 2011-06-26 NOTE — Progress Notes (Signed)
Hematology and Oncology Follow Up Visit  Sarah Burgess [id number] Jul 12, 1940 71 y.o. 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   Interim History:  There have been no intercurrent illness, hospitalizations or medication changes.Seen by dr Maud Deed 11/12, port removed.  Medications: I have reviewed the patient's current medications.  Allergies:  Allergies  Allergen Reactions  . Sulfa Drugs Cross Reactors     Past Medical History, Surgical history, Social history, and Family History were reviewed and updated.  Review of Systems: Constitutional:  Negative for fever, chills, night sweats, anorexia, weight loss, pain. Cardiovascular: no chest pain or dyspnea on exertion Respiratory: negative Neurological: negative Dermatological: negative ENT: negative Skin Gastrointestinal: negative Genito-Urinary: negative Hematological and Lymphatic: negative Breast: negative Musculoskeletal: negative Remaining ROS negative.  Physical Exam: Blood pressure 133/69, pulse 77, temperature 98 F (36.7 C), height 5\' 4"  (1.626 m), weight 253 lb 4.8 oz (114.896 kg). ECOG: 0 General appearance: alert, cooperative and appears stated age Head: Normocephalic, without obvious abnormality, atraumatic Neck: no adenopathy, no carotid bruit, no JVD, supple, symmetrical, trachea midline and thyroid not enlarged, symmetric, no tenderness/mass/nodules Lymph nodes: Cervical, supraclavicular, and axillary nodes normal. Cardiac : regular rate and rhythm, no murmurs or gallops Pulmonary:clear to auscultation bilaterally and  normal percussion bilaterally Breasts: inspection negative, no nipple discharge or bleeding, no masses or nodularity palpable, hard area lt axilla. Abdomen:soft, non-tender; bowel sounds normal; no masses,  no organomegaly Extremities negative Neuro: alert, oriented, normal speech, no focal findings or movement disorder noted  Lab Results: Lab Results  Component Value Date   WBC 9.4 06/18/2011   HGB 14.8 06/18/2011   HCT 43.2 06/18/2011   MCV 92.3 06/18/2011   PLT 297 06/18/2011     Chemistry      Component Value Date/Time   NA 138 06/18/2011 1105   K 4.3 06/18/2011 1105   CL 100 06/18/2011 1105   CO2 25 06/18/2011 1105   BUN 21 06/18/2011 1105   CREATININE 0.95 06/18/2011 1105      Component Value Date/Time   CALCIUM 9.6 06/18/2011 1105   ALKPHOS 93 06/18/2011 1105   AST 16 06/18/2011 1105   ALT 13 06/18/2011 1105   BILITOT 0.5 06/18/2011 1105      .pathology. Radiological Studies: chest X-ray n/a Mammogram Recent studies NED Bone density Due 5/13  Impression and Plan: Patient is doing well. She has no obvious evidence of local recurrence. Left axilla I appreciate is also been reviewed by Dr. Jamey Ripa. She will have a followup bone density test in May of this year and I will see her in 6 months time. She'll remain on Arimidex. Vitamin D level is excellent.  More than 50% of the visit was spent in patient-related counselling   Pierce Crane, MD 4/5/201312:53 AM

## 2011-07-29 ENCOUNTER — Other Ambulatory Visit: Payer: Self-pay | Admitting: *Deleted

## 2011-07-29 DIAGNOSIS — C50419 Malignant neoplasm of upper-outer quadrant of unspecified female breast: Secondary | ICD-10-CM

## 2011-07-29 MED ORDER — ANASTROZOLE 1 MG PO TABS: 1.0000 mg | ORAL_TABLET | Freq: Every day | ORAL | Status: DC

## 2011-08-10 ENCOUNTER — Ambulatory Visit
Admission: RE | Admit: 2011-08-10 | Discharge: 2011-08-10 | Disposition: A | Discharge status: Home / Self Care | Payer: Medicare Other | Source: Ambulatory Visit | Attending: Oncology | Admitting: Oncology

## 2011-08-10 DIAGNOSIS — Z853 Personal history of malignant neoplasm of breast: Secondary | ICD-10-CM

## 2011-08-20 ENCOUNTER — Ambulatory Visit (INDEPENDENT_AMBULATORY_CARE_PROVIDER_SITE_OTHER): Payer: Medicare Other | Admitting: Surgery

## 2011-08-20 ENCOUNTER — Encounter (INDEPENDENT_AMBULATORY_CARE_PROVIDER_SITE_OTHER): Payer: Self-pay | Admitting: Surgery

## 2011-08-20 VITALS — BP 132/80 | HR 92 | Resp 25 | Ht 64.0 in | Wt 253.0 lb

## 2011-08-20 DIAGNOSIS — Z853 Personal history of malignant neoplasm of breast: Secondary | ICD-10-CM

## 2011-08-20 NOTE — Progress Notes (Signed)
08/20/2011  Sarah Burgess is a 71 y.o.female who presents for routine followup of her Left breast cancer diagnosed in 2011 and treated with Lumpectomy, radiation, chemo. She has some tenderness in the left axilla and a lump that was noted on her last visit here.W/O was negative Her port was removed after her last visit PFSH She has had no significant changes since the last visit here.  ROS There have been no significant changes since the last visit here. She does note some right neck pain sometimes extending down the right arm  General: The patient is alert, oriented, generally healty appearing, NAD. Mood and affect are normal.  Breasts: The right breast is totally normal. The left breast has significant deformity secondary to multiple lumpectomies and radiation. There is no mass, but there is tenderness. The skin shows radiation changes Lymphatics: The right axillary and bilateral supraclavicular areas are normal. There is a 2 cm hard mass in the left axillary area just below her sentinel node incision. It is tender. This is not changed from August  Extremities: Full ROM of the surgical side with no lymphedema noted.  Data Reviewed: Mammograms recently negative except for Ca++ that looks like fat necrosis  Impression: No evidence of recurrent breast cancer in the left breast or problems with the right breast..   Plan: RTC 6 months:F/U with primary care about neck pain

## 2011-08-25 ENCOUNTER — Encounter: Payer: Self-pay | Admitting: Family Medicine

## 2011-08-25 ENCOUNTER — Ambulatory Visit (INDEPENDENT_AMBULATORY_CARE_PROVIDER_SITE_OTHER): Payer: Medicare Other | Admitting: Family Medicine

## 2011-08-25 VITALS — BP 135/82 | HR 78 | Temp 98.2°F | Ht 64.0 in | Wt 255.2 lb

## 2011-08-25 DIAGNOSIS — I10 Essential (primary) hypertension: Secondary | ICD-10-CM

## 2011-08-25 DIAGNOSIS — Z853 Personal history of malignant neoplasm of breast: Secondary | ICD-10-CM

## 2011-08-25 DIAGNOSIS — E785 Hyperlipidemia, unspecified: Secondary | ICD-10-CM

## 2011-08-25 DIAGNOSIS — L989 Disorder of the skin and subcutaneous tissue, unspecified: Secondary | ICD-10-CM

## 2011-08-25 LAB — LIPID PANEL
Cholesterol: 158 mg/dL (ref 0–200)
HDL: 47.1 mg/dL (ref >39.00)
LDL Cholesterol: 78 mg/dL (ref 0–99)
Total CHOL/HDL Ratio: 3
Triglycerides: 166 mg/dL — ABNORMAL HIGH (ref 0.0–149.0)
VLDL: 33.2 mg/dL (ref 0.0–40.0)

## 2011-08-25 LAB — HEPATIC FUNCTION PANEL
ALT: 16 U/L (ref 0–35)
AST: 18 U/L (ref 0–37)
Albumin: 3.8 g/dL (ref 3.5–5.2)
Alkaline Phosphatase: 79 U/L (ref 39–117)
Bilirubin, Direct: 0.1 mg/dL (ref 0.0–0.3)
Total Bilirubin: 0.7 mg/dL (ref 0.3–1.2)
Total Protein: 7.6 g/dL (ref 6.0–8.3)

## 2011-08-25 LAB — BASIC METABOLIC PANEL
BUN: 21 mg/dL (ref 6–23)
CO2: 27 mEq/L (ref 19–32)
Calcium: 9.6 mg/dL (ref 8.4–10.5)
Chloride: 104 mEq/L (ref 96–112)
Creatinine, Ser: 0.9 mg/dL (ref 0.4–1.2)
GFR: 70.16 mL/min (ref >60.00)
Glucose, Bld: 87 mg/dL (ref 70–99)
Potassium: 3.5 mEq/L (ref 3.5–5.1)
Sodium: 142 mEq/L (ref 135–145)

## 2011-08-25 LAB — CBC WITH DIFFERENTIAL/PLATELET
Basophils Absolute: 0.1 10*3/uL (ref 0.0–0.1)
Basophils Relative: 0.6 % (ref 0.0–3.0)
Eosinophils Absolute: 0.5 10*3/uL (ref 0.0–0.7)
Eosinophils Relative: 5.1 % — ABNORMAL HIGH (ref 0.0–5.0)
HCT: 42.4 % (ref 36.0–46.0)
Hemoglobin: 14.4 g/dL (ref 12.0–15.0)
Lymphocytes Relative: 26.8 % (ref 12.0–46.0)
Lymphs Abs: 2.4 10*3/uL (ref 0.7–4.0)
MCHC: 34 g/dL (ref 30.0–36.0)
MCV: 96.2 fl (ref 78.0–100.0)
Monocytes Absolute: 0.6 10*3/uL (ref 0.1–1.0)
Monocytes Relative: 7 % (ref 3.0–12.0)
Neutro Abs: 5.4 10*3/uL (ref 1.4–7.7)
Neutrophils Relative %: 60.5 % (ref 43.0–77.0)
Platelets: 306 10*3/uL (ref 150.0–400.0)
RBC: 4.41 Mil/uL (ref 3.87–5.11)
RDW: 12.7 % (ref 11.5–14.6)
WBC: 8.9 10*3/uL (ref 4.5–10.5)

## 2011-08-25 NOTE — Assessment & Plan Note (Signed)
New.  Given that it has suddenly changed after 35 yrs, will refer to derm for evaluation and likely excision.  Pt expressed understanding and is in agreement w/ plan.

## 2011-08-25 NOTE — Assessment & Plan Note (Signed)
New to provider.  Following w/ Dr Donnie Coffin and Dr Jamey Ripa.  On Arimidex.  UTD on mammo.  Will follow and assist as able.

## 2011-08-25 NOTE — Assessment & Plan Note (Signed)
New to provider.  Chronic for pt.  Adequate control on current meds.   Asymptomatic.  No changes.  Check labs.

## 2011-08-25 NOTE — Assessment & Plan Note (Signed)
New to provider- chronic for pt.  Tolerating statin w/out difficulty.  Unclear as to date of last labs.  Check today.  Adjust meds prn.

## 2011-08-25 NOTE — Progress Notes (Signed)
  Subjective:    Patient ID: Sarah Burgess, female    DOB: [birth year], 71 y.o.   MRN: [id number]  HPI New to establish.  Previous MD- Everlene Other.  OncDonnie Coffin  Surgeon- Streck  Endo- Altheimer (thyroid)  GI- Mann  HTN- chronic problem, on Metoprolol, Lisinopril, HCTZ.  No CP, SOB, HAs, visual changes, edema.  Hyperlipidemia- chronic problem, on Lipitor.  Denies N/V, abdominal pain, myalgias  Breast cancer- L breast cancer, following w/ Dr Jamey Ripa and Dr Donnie Coffin.  On Arimidex and UTD on mammo.  S/p 2 surgeries, most recent being Oct 2011.  S/p radiation.  Did not tolerate Chemo.  Skin lesion- L outer thigh near buttock crease.  Present for 35 yrs but in last few weeks has become very itchy.  No drainage.  Using hydrocortisone w/out relief.  ** pt did not mention joint pains **    Review of Systems For ROS see HPI     Objective:   Physical Exam  Vitals reviewed. Constitutional: She is oriented to person, place, and time. She appears well-developed and well-nourished. No distress.       obese  HENT:  Head: Normocephalic and atraumatic.  Eyes: Conjunctivae and EOM are normal. Pupils are equal, round, and reactive to light.  Neck: Normal range of motion. Neck supple. No thyromegaly present.  Cardiovascular: Normal rate, regular rhythm, normal heart sounds and intact distal pulses.   No murmur heard. Pulmonary/Chest: Effort normal and breath sounds normal. No respiratory distress.  Abdominal: Soft. She exhibits no distension. There is no tenderness.  Musculoskeletal: She exhibits no edema.  Lymphadenopathy:    She has no cervical adenopathy.  Neurological: She is alert and oriented to person, place, and time.  Skin: Skin is warm and dry.       2 cm skin lesion, vascular in appearance, on L outer upper thigh near gluteal crease  Psychiatric: She has a normal mood and affect. Her behavior is normal.          Assessment & Plan:

## 2011-08-25 NOTE — Patient Instructions (Signed)
Schedule your complete physical in 6 months We'll notify you of your lab results and make any changes if needed Someone will call you with your dermatology appt Call with any questions or concerns Welcome!  We're glad to have you!!!

## 2011-08-26 ENCOUNTER — Encounter: Payer: Self-pay | Admitting: *Deleted

## 2011-09-18 ENCOUNTER — Other Ambulatory Visit: Payer: Self-pay | Admitting: Surgery

## 2011-10-20 ENCOUNTER — Telehealth: Payer: Self-pay | Admitting: Family Medicine

## 2011-10-20 NOTE — Telephone Encounter (Signed)
Refill: Atorvastatin 10mg  tab. Take one tablet by mouth one time daily. Qty 30. Last fill 09-18-11

## 2011-10-22 MED ORDER — ATORVASTATIN CALCIUM 10 MG PO TABS: 10.0000 mg | ORAL_TABLET | Freq: Every day | ORAL | Status: DC

## 2011-10-22 NOTE — Telephone Encounter (Signed)
Refill done.  

## 2011-12-25 ENCOUNTER — Other Ambulatory Visit: Payer: Medicare Other | Admitting: Lab

## 2011-12-25 ENCOUNTER — Ambulatory Visit: Payer: Medicare Other | Admitting: Oncology

## 2011-12-28 ENCOUNTER — Other Ambulatory Visit: Payer: Self-pay | Admitting: Family Medicine

## 2011-12-28 MED ORDER — HYDROCHLOROTHIAZIDE 12.5 MG PO CAPS: 12.5000 mg | ORAL_CAPSULE | Freq: Every day | ORAL | Status: DC

## 2011-12-28 MED ORDER — METOPROLOL SUCCINATE ER 200 MG PO TB24: 200.0000 mg | ORAL_TABLET | Freq: Every day | ORAL | Status: DC

## 2011-12-28 MED ORDER — LISINOPRIL 40 MG PO TABS: 40.0000 mg | ORAL_TABLET | Freq: Every day | ORAL | Status: DC

## 2011-12-28 NOTE — Telephone Encounter (Signed)
Refills x 3 last ov 6.4.13-establish care  1-Lisinopril (Tab) ,ZESTRIL 40 MG Take on tablet by mouth, one time daily #30 wt/5-refills last fill 8.26.13  2-Hydrochlorothiazide (Cap) 12.5 MG Take one capsule by mouth one time daily #30 wt/5-refills last fill 8.26.13  3-Metoprolol ER 200 MG Take one tablet by mouth, one time daily #30 wt/5-refills last fill 8.26.13

## 2011-12-28 NOTE — Telephone Encounter (Signed)
rx sent to pharmacy by e-script to last til next apt pt advised to schedule CPE in December,

## 2011-12-29 ENCOUNTER — Telehealth: Payer: Self-pay | Admitting: Oncology

## 2011-12-29 ENCOUNTER — Ambulatory Visit (HOSPITAL_BASED_OUTPATIENT_CLINIC_OR_DEPARTMENT_OTHER): Payer: Medicare Other | Admitting: Oncology

## 2011-12-29 ENCOUNTER — Other Ambulatory Visit (HOSPITAL_BASED_OUTPATIENT_CLINIC_OR_DEPARTMENT_OTHER): Payer: Medicare Other | Admitting: Lab

## 2011-12-29 VITALS — BP 152/81 | HR 79 | Temp 98.0°F | Resp 20 | Ht 64.0 in | Wt 253.3 lb

## 2011-12-29 DIAGNOSIS — Z853 Personal history of malignant neoplasm of breast: Secondary | ICD-10-CM

## 2011-12-29 DIAGNOSIS — M949 Disorder of cartilage, unspecified: Secondary | ICD-10-CM

## 2011-12-29 DIAGNOSIS — D0590 Unspecified type of carcinoma in situ of unspecified breast: Secondary | ICD-10-CM

## 2011-12-29 DIAGNOSIS — E559 Vitamin D deficiency, unspecified: Secondary | ICD-10-CM

## 2011-12-29 DIAGNOSIS — Z170 Estrogen receptor positive status [ER+]: Secondary | ICD-10-CM

## 2011-12-29 DIAGNOSIS — C773 Secondary and unspecified malignant neoplasm of axilla and upper limb lymph nodes: Secondary | ICD-10-CM

## 2011-12-29 DIAGNOSIS — M899 Disorder of bone, unspecified: Secondary | ICD-10-CM

## 2011-12-29 DIAGNOSIS — C50419 Malignant neoplasm of upper-outer quadrant of unspecified female breast: Secondary | ICD-10-CM

## 2011-12-29 LAB — COMPREHENSIVE METABOLIC PANEL (CC13)
ALT: 12 U/L (ref 0–55)
AST: 15 U/L (ref 5–34)
Albumin: 3.9 g/dL (ref 3.5–5.0)
Alkaline Phosphatase: 93 U/L (ref 40–150)
BUN: 25 mg/dL (ref 7.0–26.0)
CO2: 22 mEq/L (ref 22–29)
Calcium: 10 mg/dL (ref 8.4–10.4)
Chloride: 102 mEq/L (ref 98–107)
Creatinine: 1 mg/dL (ref 0.6–1.1)
Glucose: 99 mg/dl (ref 70–99)
Potassium: 3.7 mEq/L (ref 3.5–5.1)
Sodium: 138 mEq/L (ref 136–145)
Total Bilirubin: 0.7 mg/dL (ref 0.20–1.20)
Total Protein: 7.2 g/dL (ref 6.4–8.3)

## 2011-12-29 LAB — CBC WITH DIFFERENTIAL/PLATELET
BASO%: 0.4 % (ref 0.0–2.0)
Basophils Absolute: 0 10*3/uL (ref 0.0–0.1)
EOS%: 2.8 % (ref 0.0–7.0)
Eosinophils Absolute: 0.3 10*3/uL (ref 0.0–0.5)
HCT: 42.7 % (ref 34.8–46.6)
HGB: 14.7 g/dL (ref 11.6–15.9)
LYMPH%: 27.3 % (ref 14.0–49.7)
MCH: 32.2 pg (ref 25.1–34.0)
MCHC: 34.4 g/dL (ref 31.5–36.0)
MCV: 93.4 fL (ref 79.5–101.0)
MONO#: 0.7 10*3/uL (ref 0.1–0.9)
MONO%: 7.7 % (ref 0.0–14.0)
NEUT#: 5.8 10*3/uL (ref 1.5–6.5)
NEUT%: 61.8 % (ref 38.4–76.8)
Platelets: 314 10*3/uL (ref 145–400)
RBC: 4.57 10*6/uL (ref 3.70–5.45)
RDW: 12.6 % (ref 11.2–14.5)
WBC: 9.4 10*3/uL (ref 3.9–10.3)
lymph#: 2.6 10*3/uL (ref 0.9–3.3)

## 2011-12-29 MED ORDER — LETROZOLE 2.5 MG PO TABS: 2.5000 mg | ORAL_TABLET | Freq: Every day | ORAL | Status: DC

## 2011-12-29 NOTE — Progress Notes (Signed)
Hematology and Oncology Follow Up Visit  Sarah Burgess [id number] July 18, 1940 71 y.o. 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   Interim History:   Patient is doing fairly well. She is on Arimidex for the past 2 months is the having a lot of joint pains primarily in the hands knees and feet. This has not affected her dramatically but she does have some limited ambulation. Otherwise she is doing well she has no other significant complaints. She does not notice any significant hot flashes. She did have a mammogram in May and this showed a few abnormalities in her left breast which might have been fat necrosis but a six-month followup was recommended. Medications: I have reviewed the patient's current medications.  Allergies:  Allergies  Allergen Reactions  . Sulfa Drugs Cross Reactors   . Tape     Breaks her out    Past Medical History, Surgical history, Social history, and Family History were reviewed and updated.  Review of Systems: Constitutional:  Negative for fever, chills, night sweats, anorexia, weight loss, pain. Cardiovascular: no chest pain or dyspnea on exertion Respiratory: negative Neurological: negative Dermatological: negative ENT: negative Skin Gastrointestinal: negative Genito-Urinary: negative Hematological and Lymphatic: negative Breast: negative Musculoskeletal: negative, and 20 pounds. Remaining ROS negative.  Physical Exam: Blood pressure 152/81, pulse 79, temperature 98 F (36.7 C), resp. rate 20, height 5\' 4"  (1.626 m), weight 253 lb 4.8 oz (114.896 kg). ECOG:  0 General appearance: alert, cooperative and appears stated age Head: Normocephalic, without obvious abnormality, atraumatic Neck: no adenopathy, no carotid bruit, no JVD, supple, symmetrical, trachea midline and thyroid not enlarged, symmetric, no tenderness/mass/nodules Lymph nodes: Cervical, supraclavicular, and axillary nodes normal. Cardiac : regular rate and rhythm, no murmurs or gallops Pulmonary:clear to auscultation bilaterally and normal percussion bilaterally Breasts: inspection negative, no nipple discharge or bleeding, no masses or nodularity palpable,  Abdomen:soft, non-tender; bowel sounds normal; no masses,  no organomegaly Extremities negative Neuro: alert, oriented, normal speech, no focal findings or movement disorder noted  Lab Results: Lab Results  Component Value Date   WBC 9.4 12/29/2011   HGB 14.7 12/29/2011   HCT 42.7 12/29/2011   MCV 93.4 12/29/2011   PLT 314 12/29/2011     Chemistry      Component Value Date/Time   NA 142 08/25/2011 1039   K 3.5 08/25/2011 1039   CL 104 08/25/2011 1039   CO2 27 08/25/2011 1039   BUN 21 08/25/2011 1039   CREATININE 0.9 08/25/2011 1039      Component Value Date/Time   CALCIUM 9.6 08/25/2011 1039   ALKPHOS 79 08/25/2011 1039   AST 18 08/25/2011 1039   ALT 16 08/25/2011 1039   BILITOT 0.7 08/25/2011 1039      .pathology. Radiological Studies: chest X-ray n/a Mammogram Recent studies NED Bone density Due 5/13  Impression and Plan: Patient is doing well. She has no obvious evidence of local recurrence, she is a followup mammogram of the left breast in November. I reviewed her bone density test which shows improvement of the bone density in your spine and slight decrease in her hip. Essentially the T score is still  a mild osteopenic range. Given his symptoms of bone and joint pain I recommended a switch to letrozole. I recommended we stop Arimidex. I've sent a prescription for letrozole. I recommended this and does suggest that she be no  difference in outcome. I will plan to see her in followup in 6 months time.   Pierce Crane, MD 10/8/20131:50 PM

## 2011-12-29 NOTE — Telephone Encounter (Signed)
gve the pt her April 2014 appt calendar °

## 2011-12-30 LAB — VITAMIN D 25 HYDROXY (VIT D DEFICIENCY, FRACTURES): Vit D, 25-Hydroxy: 65 ng/mL (ref 30–89)

## 2012-01-04 ENCOUNTER — Other Ambulatory Visit: Payer: Self-pay | Admitting: Oncology

## 2012-01-04 DIAGNOSIS — N641 Fat necrosis of breast: Secondary | ICD-10-CM

## 2012-01-12 ENCOUNTER — Encounter: Payer: Self-pay | Admitting: Family Medicine

## 2012-01-12 ENCOUNTER — Ambulatory Visit (INDEPENDENT_AMBULATORY_CARE_PROVIDER_SITE_OTHER): Payer: Medicare Other | Admitting: Family Medicine

## 2012-01-12 VITALS — BP 116/80 | HR 78 | Temp 98.5°F | Ht 63.25 in | Wt 252.6 lb

## 2012-01-12 DIAGNOSIS — R3989 Other symptoms and signs involving the genitourinary system: Secondary | ICD-10-CM

## 2012-01-12 DIAGNOSIS — R399 Unspecified symptoms and signs involving the genitourinary system: Secondary | ICD-10-CM | POA: Insufficient documentation

## 2012-01-12 LAB — POCT URINALYSIS DIPSTICK
Bilirubin, UA: NEGATIVE
Glucose, UA: NEGATIVE
Ketones, UA: NEGATIVE
Leukocytes, UA: NEGATIVE
Nitrite, UA: NEGATIVE
Protein, UA: NEGATIVE
Spec Grav, UA: 1.01
Urobilinogen, UA: 0.2
pH, UA: 6

## 2012-01-12 MED ORDER — CEPHALEXIN 500 MG PO CAPS: 500.0000 mg | ORAL_CAPSULE | Freq: Two times a day (BID) | ORAL | Status: DC

## 2012-01-12 NOTE — Patient Instructions (Addendum)
You most likely have a bladder infection Start the Keflex twice daily while we wait for the culture Drink plenty of fluids Call with any questions or concerns Hang in there!

## 2012-01-12 NOTE — Progress Notes (Signed)
  Subjective:    Patient ID: Sarah Burgess, female    DOB: 1940/04/07, 71 y.o.   MRN: [id number]  HPI ? UTI- sxs started 4 days ago w/ urgency but hesitancy.  + frequency.  + pressure, dysuria.  No fevers, chills.  Mild low back pain.   Review of Systems For ROS see HPI     Objective:   Physical Exam  Vitals reviewed. Constitutional: She appears well-developed and well-nourished. No distress.  Abdominal: Soft. She exhibits no distension. There is no tenderness (no suprapubic or CVA tenderness).          Assessment & Plan:

## 2012-01-14 LAB — URINE CULTURE: Colony Count: >=100000

## 2012-01-19 NOTE — Assessment & Plan Note (Signed)
Pt's sxs consistent w/ UTI.  Start abx.  Await UA and cx results.  Change empiric abx if needed.

## 2012-02-05 ENCOUNTER — Ambulatory Visit
Admission: RE | Admit: 2012-02-05 | Discharge: 2012-02-05 | Disposition: A | Discharge status: Home / Self Care | Payer: Medicare Other | Source: Ambulatory Visit | Attending: Oncology | Admitting: Oncology

## 2012-02-05 DIAGNOSIS — N641 Fat necrosis of breast: Secondary | ICD-10-CM

## 2012-02-12 ENCOUNTER — Encounter (INDEPENDENT_AMBULATORY_CARE_PROVIDER_SITE_OTHER): Payer: Self-pay | Admitting: Surgery

## 2012-02-12 ENCOUNTER — Ambulatory Visit (INDEPENDENT_AMBULATORY_CARE_PROVIDER_SITE_OTHER): Payer: Medicare Other | Admitting: Surgery

## 2012-02-12 VITALS — BP 124/82 | HR 82 | Temp 97.8°F | Resp 14 | Ht 63.0 in | Wt 253.6 lb

## 2012-02-12 DIAGNOSIS — Z853 Personal history of malignant neoplasm of breast: Secondary | ICD-10-CM

## 2012-02-12 NOTE — Patient Instructions (Signed)
Continue annual mammograms and see me again next year 

## 2012-02-12 NOTE — Progress Notes (Signed)
02/12/2012  Sarah Burgess is a 71 y.o.female who presents for routine followup of her Left breast cancer diagnosed in 2011 and treated with Lumpectomy, radiation, chemo. She has some tenderness in the left axilla and a lump that was noted on her last visit here.W/O was negative Her port was removed after her last visit PFSH She has had no significant changes since the last visit here.  ROS There have been no significant changes since the last visit here.  General: The patient is alert, oriented, generally healty appearing, NAD. Mood and affect are normal.  Breasts: The right breast is totally normal. The left breast has significant deformity secondary to multiple lumpectomies and radiation. There is no mass, but there is tenderness. There is a rounded noular area in the center of the lumpectomy incision which I think is the area of fat necrosis.The skin shows radiation changes Lymphatics: The right axillary and bilateral supraclavicular areas are normal. There is a 2 cm hard mass in the left axillary area just below her sentinel node incision. It is tender. This is not changed from last visit here  Extremities: Full ROM of the surgical side with no lymphedema noted.  Data Reviewed: Left mammo last month confirmed that the Ca++ appeared to be benign fat necrosis  Impression: No evidence of recurrent breast cancer in the left breast or problems with the right breast..   Plan: RTC one year

## 2012-02-15 ENCOUNTER — Other Ambulatory Visit: Payer: Self-pay | Admitting: Family Medicine

## 2012-02-15 MED ORDER — METOPROLOL SUCCINATE ER 200 MG PO TB24: 200.0000 mg | ORAL_TABLET | Freq: Every day | ORAL | Status: DC

## 2012-02-15 MED ORDER — LISINOPRIL 40 MG PO TABS: 40.0000 mg | ORAL_TABLET | Freq: Every day | ORAL | Status: DC

## 2012-02-15 MED ORDER — HYDROCHLOROTHIAZIDE 12.5 MG PO CAPS: 12.5000 mg | ORAL_CAPSULE | Freq: Every day | ORAL | Status: DC

## 2012-02-15 NOTE — Telephone Encounter (Signed)
Refill done.  

## 2012-02-15 NOTE — Telephone Encounter (Signed)
refill ZESTRIL 40 MG Take 1 tablet (40 mg total) by mouth daily. last fill 10.31.13 #30 last ov 10.22.13  labs done 10.22.13

## 2012-02-15 NOTE — Telephone Encounter (Signed)
Please add also   1-MICROZIDE 12.5 MG Take 1 capsule (12.5 mg total) by mouth daily, last fill 10.31.13 #30  2-TOPROL-XL 200 MG Take 1 tablet (200 mg total) by mouth daily last fill 10.31.13 #30

## 2012-02-22 ENCOUNTER — Other Ambulatory Visit: Payer: Self-pay | Admitting: Family Medicine

## 2012-02-22 MED ORDER — ATORVASTATIN CALCIUM 10 MG PO TABS: 10.0000 mg | ORAL_TABLET | Freq: Every day | ORAL | Status: DC

## 2012-02-22 NOTE — Telephone Encounter (Signed)
Rx sent 

## 2012-02-22 NOTE — Telephone Encounter (Signed)
refill LIPITOR 10 MG Take 1 tablet (10 mg total) by mouth daily. #30 last fill 11.6.13, last ov wt/labs 10.22.13

## 2012-03-28 ENCOUNTER — Other Ambulatory Visit: Payer: Self-pay | Admitting: Family Medicine

## 2012-03-28 MED ORDER — ATORVASTATIN CALCIUM 10 MG PO TABS: 10.0000 mg | ORAL_TABLET | Freq: Every day | ORAL | Status: DC

## 2012-03-28 NOTE — Telephone Encounter (Signed)
refill LIPITOR 10 MG Take 1 tablet (10 mg total) by mouth daily. #30 last fill 12.02.13, last ov wt/labs 10.22.13

## 2012-03-28 NOTE — Telephone Encounter (Signed)
Rx sent to the pharmacy by e-script.//AB/CMA 

## 2012-04-25 ENCOUNTER — Other Ambulatory Visit: Payer: Self-pay | Admitting: Family Medicine

## 2012-05-16 ENCOUNTER — Telehealth: Payer: Self-pay | Admitting: *Deleted

## 2012-05-16 NOTE — Telephone Encounter (Signed)
Received call back from patient to reschedule her appt. Confirmed appt. For 06/30/12 at 2pm with Norina Buzzard, NP.  Then will become Dr. Darnelle Catalan.

## 2012-05-16 NOTE — Telephone Encounter (Signed)
Left message for a return phone call to reschedule her appt.  

## 2012-05-30 ENCOUNTER — Encounter: Payer: Self-pay | Admitting: Oncology

## 2012-05-30 ENCOUNTER — Encounter: Payer: Self-pay | Admitting: *Deleted

## 2012-05-30 NOTE — Progress Notes (Signed)
Mailed letter & calendar to pt. 

## 2012-06-30 ENCOUNTER — Ambulatory Visit: Payer: Medicare Other | Admitting: Oncology

## 2012-06-30 ENCOUNTER — Encounter: Payer: Self-pay | Admitting: Family

## 2012-06-30 ENCOUNTER — Other Ambulatory Visit: Payer: Medicare Other | Admitting: Lab

## 2012-06-30 ENCOUNTER — Ambulatory Visit (HOSPITAL_BASED_OUTPATIENT_CLINIC_OR_DEPARTMENT_OTHER): Payer: Medicare Other | Admitting: Lab

## 2012-06-30 ENCOUNTER — Telehealth: Payer: Self-pay | Admitting: *Deleted

## 2012-06-30 ENCOUNTER — Ambulatory Visit (HOSPITAL_BASED_OUTPATIENT_CLINIC_OR_DEPARTMENT_OTHER): Payer: Medicare Other | Admitting: Family

## 2012-06-30 VITALS — BP 123/72 | HR 83 | Temp 98.3°F | Resp 20 | Ht 63.0 in | Wt 248.4 lb

## 2012-06-30 DIAGNOSIS — E559 Vitamin D deficiency, unspecified: Secondary | ICD-10-CM

## 2012-06-30 DIAGNOSIS — M810 Age-related osteoporosis without current pathological fracture: Secondary | ICD-10-CM

## 2012-06-30 DIAGNOSIS — Z853 Personal history of malignant neoplasm of breast: Secondary | ICD-10-CM

## 2012-06-30 DIAGNOSIS — D72829 Elevated white blood cell count, unspecified: Secondary | ICD-10-CM

## 2012-06-30 LAB — CBC WITH DIFFERENTIAL/PLATELET
BASO%: 0.6 % (ref 0.0–2.0)
Basophils Absolute: 0.1 10*3/uL (ref 0.0–0.1)
EOS%: 3.9 % (ref 0.0–7.0)
Eosinophils Absolute: 0.4 10*3/uL (ref 0.0–0.5)
HCT: 43.8 % (ref 34.8–46.6)
HGB: 14.8 g/dL (ref 11.6–15.9)
LYMPH%: 26.9 % (ref 14.0–49.7)
MCH: 31 pg (ref 25.1–34.0)
MCHC: 33.8 g/dL (ref 31.5–36.0)
MCV: 91.8 fL (ref 79.5–101.0)
MONO#: 0.7 10*3/uL (ref 0.1–0.9)
MONO%: 6.6 % (ref 0.0–14.0)
NEUT#: 7.1 10*3/uL — ABNORMAL HIGH (ref 1.5–6.5)
NEUT%: 62 % (ref 38.4–76.8)
Platelets: 299 10*3/uL (ref 145–400)
RBC: 4.77 10*6/uL (ref 3.70–5.45)
RDW: 12.7 % (ref 11.2–14.5)
WBC: 11.4 10*3/uL — ABNORMAL HIGH (ref 3.9–10.3)
lymph#: 3.1 10*3/uL (ref 0.9–3.3)

## 2012-06-30 LAB — COMPREHENSIVE METABOLIC PANEL (CC13)
ALT: 11 U/L (ref 0–55)
AST: 15 U/L (ref 5–34)
Albumin: 3.7 g/dL (ref 3.5–5.0)
Alkaline Phosphatase: 94 U/L (ref 40–150)
BUN: 26.5 mg/dL — ABNORMAL HIGH (ref 7.0–26.0)
CO2: 27 mEq/L (ref 22–29)
Calcium: 10.1 mg/dL (ref 8.4–10.4)
Chloride: 102 mEq/L (ref 98–107)
Creatinine: 1.1 mg/dL (ref 0.6–1.1)
Glucose: 114 mg/dl — ABNORMAL HIGH (ref 70–99)
Potassium: 3.9 mEq/L (ref 3.5–5.1)
Sodium: 139 mEq/L (ref 136–145)
Total Bilirubin: 0.4 mg/dL (ref 0.20–1.20)
Total Protein: 7.7 g/dL (ref 6.4–8.3)

## 2012-06-30 LAB — LACTATE DEHYDROGENASE (CC13): LDH: 201 U/L (ref 125–245)

## 2012-06-30 MED ORDER — LETROZOLE 2.5 MG PO TABS: 2.5000 mg | ORAL_TABLET | Freq: Every day | ORAL | Status: DC

## 2012-06-30 NOTE — Patient Instructions (Addendum)
Please contact us at [phone] if you have any questions or concerns.  Continue to take vitamin D and Calcium daily.  Health Maintenance, Females  A healthy lifestyle and preventative care can promote health and wellness.  Maintain regular health, dental, and eye exams.  Eat a healthy diet. Foods like vegetables, fruits, whole grains, low-fat dairy products, and lean protein foods contain the nutrients you need without too many calories. Decrease your intake of foods high in solid fats, added sugars, and salt. Get information about a proper diet from your caregiver, if necessary.  Regular physical exercise is one of the most important things you can do for your health. Most adults should get at least 150 minutes of moderate-intensity exercise (any activity that increases your heart rate and causes you to sweat) each week. In addition, most adults need muscle-strengthening exercises on 2 or more days a week.  Maintain a healthy weight. The body mass index (BMI) is a screening tool to identify possible weight problems. It provides an estimate of body fat based on height and weight. Your caregiver can help determine your BMI, and can help you achieve or maintain a healthy weight. For adults 20 years and older:  A BMI below 18.5 is considered underweight.  A BMI of 18.5 to 24.9 is normal.  A BMI of 25 to 29.9 is considered overweight.  A BMI of 30 and above is considered obese.  Maintain normal blood lipids and cholesterol by exercising and minimizing your intake of saturated fat. Eat a balanced diet with plenty of fruits and vegetables. Blood tests for lipids and cholesterol should begin at age 65 and be repeated every 5 years. If your lipid or cholesterol levels are high, you are over 50, or you are a high risk for heart disease, you may need your cholesterol levels checked more frequently. Ongoing high lipid and cholesterol levels should be treated with medicines if diet and exercise are not  effective.  If you smoke, find out from your caregiver how to quit. If you do not use tobacco, do not start.  If you are pregnant, do not drink alcohol. If you are breastfeeding, be very cautious about drinking alcohol. If you are not pregnant and choose to drink alcohol, do not exceed 1 drink per day. One drink is considered to be 12 ounces (355 mL) of beer, 5 ounces (148 mL) of wine, or 1.5 ounces (44 mL) of liquor.  Avoid use of street drugs. Do not share needles with anyone. Ask for help if you need support or instructions about stopping the use of drugs.  High blood pressure causes heart disease and increases the risk of stroke. Blood pressure should be checked at least every 1 to 2 years. Ongoing high blood pressure should be treated with medicines, if weight loss and exercise are not effective.  If you are 80 to 72 years old, ask your caregiver if you should take aspirin to prevent strokes.  Diabetes screening involves taking a blood sample to check your fasting blood sugar level. This should be done once every 3 years, after age 6, if you are within normal weight and without risk factors for diabetes. Testing should be considered at a younger age or be carried out more frequently if you are overweight and have at least 1 risk factor for diabetes.  Breast cancer screening is essential preventative care for women. You should practice "breast self-awareness." This means understanding the normal appearance and feel of your breasts and may  include breast self-examination. Any changes detected, no matter how small, should be reported to a caregiver. Women in their 83s and 30s should have a clinical breast exam (CBE) by a caregiver as part of a regular health exam every 1 to 3 years. After age 71, women should have a CBE every year. Starting at age 66, women should consider having a mammogram (breast X-ray) every year. Women who have a family history of breast cancer should talk to their caregiver about  genetic screening. Women at a high risk of breast cancer should talk to their caregiver about having an MRI and a mammogram every year.  The Pap test is a screening test for cervical cancer. Women should have a Pap test starting at age 36. Between ages 18 and 68, Pap tests should be repeated every 2 years. Beginning at age 18, you should have a Pap test every 3 years as long as the past 3 Pap tests have been normal. If you had a hysterectomy for a problem that was not cancer or a condition that could lead to cancer, then you no longer need Pap tests. If you are between ages 67 and 90, and you have had normal Pap tests going back 10 years, you no longer need Pap tests. If you have had past treatment for cervical cancer or a condition that could lead to cancer, you need Pap tests and screening for cancer for at least 20 years after your treatment. If Pap tests have been discontinued, risk factors (such as a new sexual partner) need to be reassessed to determine if screening should be resumed. Some women have medical problems that increase the chance of getting cervical cancer. In these cases, your caregiver may recommend more frequent screening and Pap tests.  The human papillomavirus (HPV) test is an additional test that may be used for cervical cancer screening. The HPV test looks for the virus that can cause the cell changes on the cervix. The cells collected during the Pap test can be tested for HPV. The HPV test could be used to screen women aged 13 years and older, and should be used in women of any age who have unclear Pap test results. After the age of 48, women should have HPV testing at the same frequency as a Pap test.  Colorectal cancer can be detected and often prevented. Most routine colorectal cancer screening begins at the age of 2 and continues through age 75. However, your caregiver may recommend screening at an earlier age if you have risk factors for colon cancer. On a yearly basis, your  caregiver may provide home test kits to check for hidden blood in the stool. Use of a small camera at the end of a tube, to directly examine the colon (sigmoidoscopy or colonoscopy), can detect the earliest forms of colorectal cancer. Talk to your caregiver about this at age 94, when routine screening begins. Direct examination of the colon should be repeated every 5 to 10 years through age 73, unless early forms of pre-cancerous polyps or small growths are found.  Hepatitis C blood testing is recommended for all people born from [birth year] through 1965 and any individual with known risks for hepatitis C.  Practice safe sex. Use condoms and avoid high-risk sexual practices to reduce the spread of sexually transmitted infections (STIs). Sexually active women aged 6 and younger should be checked for Chlamydia, which is a common sexually transmitted infection. Older women with new or multiple partners should also be tested for  Chlamydia. Testing for other STIs is recommended if you are sexually active and at increased risk.  Osteoporosis is a disease in which the bones lose minerals and strength with aging. This can result in serious bone fractures. The risk of osteoporosis can be identified using a bone density scan. Women ages 23 and over and women at risk for fractures or osteoporosis should discuss screening with their caregivers. Ask your caregiver whether you should be taking a calcium supplement or vitamin D to reduce the rate of osteoporosis.  Menopause can be associated with physical symptoms and risks. Hormone replacement therapy is available to decrease symptoms and risks. You should talk to your caregiver about whether hormone replacement therapy is right for you.  Use sunscreen with a sun protection factor (SPF) of 30 or greater. Apply sunscreen liberally and repeatedly throughout the day. You should seek shade when your shadow is shorter than you. Protect yourself by wearing long sleeves, pants, a  wide-brimmed hat, and sunglasses year round, whenever you are outdoors.  Notify your caregiver of new moles or changes in moles, especially if there is a change in shape or color. Also notify your caregiver if a mole is larger than the size of a pencil eraser.  Stay current with your immunizations.  Document Released: 09/22/2010 Document Revised: 06/01/2011 Document Reviewed: 09/22/2010 [facility] Patient Information 2013 [place], [place].

## 2012-06-30 NOTE — Progress Notes (Signed)
[facility]  [phone] [phone]  OFFICE PROGRESS NOTE   ID: Sarah Burgess   DOB: [birth year]  MR#: [id number]  XBM#:[id number]   PCP: Neena Rhymes, MD SU: Currie Paris, MD RAD ONC:  Margaretmary Dys, MD   HISTORY OF PRESENT ILLNESS: From Dr. Theron Arista Rubin's new patient evaluation dated 06/19/2009: "This is a delightful 72 year old woman from [place] referred by Dr. Jamey Ripa for evaluation and treatment of breast cancer. This patient has been having some pain in her left breast for sometime.  She had not had a previous mammogram for about three years.  Her primary care doctor is Dr. Everlene Other.  She was sent for a mammogram and the mammogram showed a mass measuring 2.2 cm in greatest dimension, area of calcifications extending for about 70 cm.  There were scattered benign appearing microcalcifications bilaterally.  Physical exam showed a palpable mass within the left breast 7 cm from the nipple, 2.5 cm by physical exam.  Ultrasound was performed showing irregular hypoechoic mass measuring 1.9 x 1.8 x 1.4 cm, showed normal appearing lymph nodes identified as well, largest measuring 1.3 cm with a loss of fatty hilum.  The patient was referred for a biopsy, which took place on 06/10/2009.  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  This was ER positive 100% and PR positive 4%, proliferative index 19%, HER-2 was non-amplified with a ratio of 1.4.  The patient was referred for possible neoadjuvant therapy.  Of note is that she was due to have an MRI scan, which was cancelled because of concerns regarding her elevated creatinine."  Her subsequent history is as detailed below  INTERVAL HISTORY: Dr. Darnelle Catalan and I saw Sarah Burgess today for follow up of left axilla metastatic carcinoma.  She was last seen by Dr. Donnie Coffin on 12/29/2011.  Since her last office visit, she has been doing fair.  She is establishing herself with Dr. Darrall Dears  service today.  REVIEW OF SYSTEMS: A 10 point review of systems was completed and is negative except for complaints of ongoing arthritis aching in her shoulders, knees and left hip.  She has also had occasional shortness of breath with exertion.  The patient denies any other symptomatology.   PAST MEDICAL HISTORY: Past Medical History  Diagnosis Date  . Diverticulitis   . Pneumonia   . Thyroid disease   . Hemorrhoids   . Arthritis   . Hernia   . Asthma   . GERD (gastroesophageal reflux disease)   . Allergy   . Cancer     left breast and thyroid   This patient has had a history of hypertension, history of incontinence, and history of hemorrhoids.  PAST SURGICAL HISTORY: Past Surgical History  Procedure Laterality Date  . Cholecystectomy    . Abdominal hysterectomy    . Breast lumpectomy  2011    left breast  . Port-a-cath removal  02/23/2011    Procedure: REMOVAL PORT-A-CATH;  Surgeon: Currie Paris, MD;  Location: [facility];  Service: General;  Laterality: N/A;  remove access port right chest  She had a hysterectomy with oophorectomy for vaginal spotting.  She subsequently had pneumonia postoperatively.  FAMILY HISTORY Family History  Problem Relation Age of Onset  . Stroke Mother   . Heart disease Mother   . Early death Mother   . Diabetes Mother   . Cancer Father     pt unaware of what kind  . Heart disease Father   .  Diabetes Father   . Heart disease Sister   . Diabetes Sister   . Arthritis Son   Father died age 72.  Mother died age 71 in 2.  She has no siblings.  GYNECOLOGIC HISTORY: G2, P2, menarche age 11, no history of hormone replacement therapy after hysterectomy.  SOCIAL HISTORY: Sarah Burgess has been a widow for over 15 years.  She has been married twice, her first marriage lasted 19 years, her second marriage lasted for 14 years.  She has two adult children from her first marriage, one son here, one son in [place].  She has 3  adult grandsons.  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  In her spare time she likes to attend church, go to the movies, reading, and watch television.  ADVANCED DIRECTIVES: Not on file  HEALTH MAINTENANCE: History  Substance Use Topics  . Smoking status: Never Smoker   . Smokeless tobacco: Not on file  . Alcohol Use: No    Colonoscopy: Not on file PAP: Not on file Bone density: Last bone density examination on 08/10/2011 showed a T score of -1.6 (osteopenia). Lipid panel: 08/25/2011  Allergies  Allergen Reactions  . Sulfa Drugs Cross Reactors   . Tape     Breaks her out    Current Outpatient Prescriptions  Medication Sig Dispense Refill  . acetaminophen (TYLENOL) 325 MG tablet Take 650 mg by mouth every 6 (six) hours as needed.      Marland Kitchen atorvastatin (LIPITOR) 10 MG tablet TAKE ONE TABLET BY MOUTH ONE TIME DAILY  30 tablet  5  . Calcium Carbonate (CALTRATE 600 PO) Take by mouth 2 (two) times daily.        . Cholecalciferol (VITAMIN D) 2000 UNITS CAPS Take by mouth daily.        . hydrochlorothiazide (MICROZIDE) 12.5 MG capsule TAKE ONE CAPSULE BY MOUTH ONE TIME DAILY  30 capsule  5  . letrozole (FEMARA) 2.5 MG tablet Take 1 tablet (2.5 mg total) by mouth daily.  90 tablet  8  . lisinopril (PRINIVIL,ZESTRIL) 40 MG tablet TAKE ONE TABLET BY MOUTH ONE TIME DAILY  30 tablet  5  . metoprolol (TOPROL-XL) 200 MG 24 hr tablet TAKE ONE TABLET BY MOUTH ONE TIME DAILY  30 tablet  5  . ranitidine (ZANTAC) 75 MG tablet Take 75 mg by mouth 2 (two) times daily.       No current facility-administered medications for this visit.    OBJECTIVE: Filed Vitals:   06/30/12 1420  BP: 123/72  Pulse: 83  Temp: 98.3 F (36.8 C)  Resp: 20     Body mass index is 44.01 kg/(m^2).      ECOG FS:  Grade 1 - Symptomatic, but fully ambulatory            General appearance: Alert, cooperative, well nourished, no apparent distress Head: Normocephalic, without obvious  abnormality, atraumatic Eyes: Arcus senilis, PERRLA, EOMI Nose: Nares, septum and mucosa are normal, no drainage or sinus tenderness Neck: Excessive habitus, supple, symmetrical, trachea midline, thyroid not enlarged, no tenderness Resp: Clear to auscultation bilaterally Cardio: Regular rate and rhythm, S1, S2 normal, no murmur, click, rub or gallop Breasts Right breast is pendulous, left breast has well-healed surgical scars, left breast is visibly smaller than the right breast and has radiation changes, left breast is quite glandular and has architectural distortions, no lymphadenopathy, no nipple inversion, no axilla fullness GI: Soft, distended, non-tender, hypoactive bowel sounds, excessive  habitus Extremities: Extremities normal, atraumatic, no cyanosis or edema, bilateral lower extremity varicose veins Lymph nodes: Cervical and supraclavicular are normal Neurologic: Grossly normal   LAB RESULTS: Lab Results  Component Value Date   WBC 11.4* 06/30/2012   NEUTROABS 7.1* 06/30/2012   HGB 14.8 06/30/2012   HCT 43.8 06/30/2012   MCV 91.8 06/30/2012   PLT 299 06/30/2012      Chemistry      Component Value Date/Time   NA 139 06/30/2012 1609   NA 142 08/25/2011 1039   K 3.9 06/30/2012 1609   K 3.5 08/25/2011 1039   CL 102 06/30/2012 1609   CL 104 08/25/2011 1039   CO2 27 06/30/2012 1609   CO2 27 08/25/2011 1039   BUN 26.5* 06/30/2012 1609   BUN 21 08/25/2011 1039   CREATININE 1.1 06/30/2012 1609   CREATININE 0.9 08/25/2011 1039      Component Value Date/Time   CALCIUM 10.1 06/30/2012 1609   CALCIUM 9.6 08/25/2011 1039   ALKPHOS 94 06/30/2012 1609   ALKPHOS 79 08/25/2011 1039   AST 15 06/30/2012 1609   AST 18 08/25/2011 1039   ALT 11 06/30/2012 1609   ALT 16 08/25/2011 1039   BILITOT 0.40 06/30/2012 1609   BILITOT 0.7 08/25/2011 1039       Lab Results  Component Value Date   LABCA2 14 01/22/2011    Urinalysis    Component Value Date/Time   COLORURINE AMBER BIOCHEMICALS MAY BE AFFECTED BY COLOR*  10/26/2009 1628   APPEARANCEUR CLOUDY* 10/26/2009 1628   LABSPEC 1.026 10/26/2009 1628   PHURINE 5.0 10/26/2009 1628   GLUCOSEU NEGATIVE 10/26/2009 1628   HGBUR NEGATIVE 10/26/2009 1628   BILIRUBINUR neg 01/12/2012 1153   BILIRUBINUR SMALL* 10/26/2009 1628   KETONESUR TRACE* 10/26/2009 1628   PROTEINUR 100* 10/26/2009 1628   UROBILINOGEN 0.2 01/12/2012 1153   UROBILINOGEN 1.0 10/26/2009 1628   NITRITE neg 01/12/2012 1153   NITRITE NEGATIVE 10/26/2009 1628   LEUKOCYTESUR Negative 01/12/2012 1153    STUDIES: No results found.  ASSESSMENT: 72 y.[place]. [place], [place]:  1.  Status post left breast needle core biopsy at the 2:30 position and left axilla biopsy on 06/10/2009 which showed invasive ductal carcinoma and metastatic carcinoma in the axilla area, ER 100%, PR 4%, Ki-67 19%.  2.  Status post left breast lumpectomy with breast excision x 4 and lymph node resection on 07/18/2009 for a stage IIB, pT2 pN1a, 2.2 cm invasive ductal carcinoma with extracellular mucin, multifocal high-grade DCIS with calcifications, focal necrosis and lymphovascular invasion, close margins, grade 2, ER 100%, PR 4%, KI 6719%, HER-2/neu negative by CISH with a ratio of 1.40, 1/8 positive lymph nodes.  3.  Status post chemotherapy with TC x1 cycle on 08/29/2009 resulted in the patient having a brief hospitalization.  Status post chemotherapy with CMF x1 cycle on 10/17/2009 also resulted in the patient being hospitalized.  4.  Status post left breast needle core biopsy on 12/02/2009 for recurrence of DCIS with calcifications.  5.  Status post radiation therapy from 02/03/2010 through 03/25/2010.  6.  The patient started antiestrogen therapy with Arimidex from 03/2010 through 12/2011.  Her antiestrogen therapy was then changed to Femara from 12/2011 to the present.  7.  Abnormal bilateral digital diagnostic mammogram on 08/10/2011 which showed evidence of a probable fat necrosis in the anterior aspect of the left  lumpectomy site.  A 6 month follow up left diagnostic mammogram was recommended.  The patient had a follow  up left digital diagnostic mammogram on 02/05/2012 which showed left breast calcifications appeared benign and compatible with fat necrosis, no other suspicious findings within the left breast.  Bilateral digital diagnostic mammogram was recommended in 6 months (07/2012).  8.  Osteopenia  9.  Leukocytosis     PLAN: Dr. Darnelle Catalan discussed with the patient that she has an 85% chance that her cancer will not return per [employer] when she asked what her prognosis was. The patient will continue antiestrogen therapy with Femara 2.5 mg by mouth daily.  An electronic prescription for Femara was sent to the patient's pharmacy for #90 with 8 refills.  The patient's next bilateral digital diagnostic mammogram is due in 07/2012, we will schedule this for her.  The patient was encouraged to continue taking calcium 1200 mg and vitamin D 2000 IUs daily for her osteopenia.  Leukocytosis is noted on patient's current laboratory values, we will continue to monitor, likely to be transient.  We plan to see the patient again in one year at which time we will check a CBC, CMP, LDH, and vitamin D level.  All questions were answered.  The patient was encouraged to contact us in the interim with any problems, questions or concerns.   Larina Bras, NP-C 07/03/2012, 1:24 PM

## 2012-06-30 NOTE — Telephone Encounter (Signed)
appts made and printed 

## 2012-07-01 LAB — VITAMIN D 25 HYDROXY (VIT D DEFICIENCY, FRACTURES): Vit D, 25-Hydroxy: 64 ng/mL (ref 30–89)

## 2012-08-10 ENCOUNTER — Ambulatory Visit
Admission: RE | Admit: 2012-08-10 | Discharge: 2012-08-10 | Disposition: A | Discharge status: Home / Self Care | Payer: Medicare Other | Source: Ambulatory Visit | Attending: Family | Admitting: Family

## 2012-08-10 DIAGNOSIS — Z853 Personal history of malignant neoplasm of breast: Secondary | ICD-10-CM

## 2012-08-11 ENCOUNTER — Telehealth: Payer: Self-pay

## 2012-08-11 NOTE — Telephone Encounter (Signed)
Spoke with Sarah Burgess regarding mammogram results. Per [facility], mammogram ok. Sarah Burgess voiced understanding and knows to call the office with any questions. TMB

## 2012-09-12 ENCOUNTER — Encounter: Payer: Self-pay | Admitting: Family Medicine

## 2012-09-12 ENCOUNTER — Ambulatory Visit (INDEPENDENT_AMBULATORY_CARE_PROVIDER_SITE_OTHER): Payer: Medicare Other | Admitting: Family Medicine

## 2012-09-12 VITALS — BP 110/60 | HR 98 | Temp 97.9°F | Ht 63.25 in | Wt 244.2 lb

## 2012-09-12 DIAGNOSIS — R21 Rash and other nonspecific skin eruption: Secondary | ICD-10-CM | POA: Insufficient documentation

## 2012-09-12 MED ORDER — CLOTRIMAZOLE-BETAMETHASONE 1-0.05 % EX CREA: TOPICAL_CREAM | Freq: Two times a day (BID) | CUTANEOUS | Status: DC

## 2012-09-12 MED ORDER — PERMETHRIN 5 % EX CREA: TOPICAL_CREAM | Freq: Once | CUTANEOUS | Status: DC

## 2012-09-12 MED ORDER — HYDROXYZINE HCL 50 MG PO TABS: 50.0000 mg | ORAL_TABLET | Freq: Three times a day (TID) | ORAL | Status: DC | PRN

## 2012-09-12 NOTE — Assessment & Plan Note (Signed)
New.  Pt 2 separate issues- scabies and fungal dermatitis.  Start elimite for scabies and start Lotrisone for fungal dermatitis.  Atarax prn for itching.  Reviewed supportive care and red flags that should prompt return.  Pt expressed understanding and is in agreement w/ plan.

## 2012-09-12 NOTE — Progress Notes (Signed)
  Subjective:    Patient ID: [facility], female    DOB: May 12, 1940, 72 y.o.   MRN: [id number]  HPI Rash- sxs started 1 week ago, very itchy.  Located across central abdomen and in bilateral groin creases.  Has tried OTC hydrocortisone cream, rubbing ETOH.  Similar area on L wrist.  No pet exposure.  Contact w/ multiple small children but none w/ known rashes at this time.   Review of Systems For ROS see HPI     Objective:   Physical Exam  Vitals reviewed. Constitutional: She appears well-developed and well-nourished. No distress.  Skin: Skin is warm and dry. Rash (scattered scabbed areas on abd and groin, similar scabbed areas in linear pattern on L wrist.  + excoriations.  separate fungal rash under panus crease and in groin) noted.          Assessment & Plan:

## 2012-09-12 NOTE — Patient Instructions (Addendum)
This appears to be scabies Use the Elimite (permethrin) Cream tonight- apply from jaw down and wash off in AM Wash all sheets, towels, and clothes in AM Starting tomorrow- apply the Lotrisone combo cream on abdomen and groin twice daily Use the atarax as needed for itching- may cause drowsiness so no driving Hang in there!

## 2012-10-24 ENCOUNTER — Other Ambulatory Visit: Payer: Self-pay | Admitting: Family Medicine

## 2012-10-26 NOTE — Telephone Encounter (Signed)
Pt returned your call.  

## 2012-10-26 NOTE — Telephone Encounter (Signed)
LM @ (3:20pm) asking the pt or her son to RTC regarding med refill and scheduling the pt for an appt.//AB/CMA

## 2012-10-27 NOTE — Telephone Encounter (Signed)
Rx sent to the pharmacy by e-script.//AB/CMA 

## 2012-12-19 ENCOUNTER — Telehealth: Payer: Self-pay

## 2012-12-19 NOTE — Telephone Encounter (Signed)
No HM information on file.  Patients states "it may be in  my old records" States that she had a pneumonia vaccine by unsure how long it has been. ROI needed  Possibly Dr. Everlene Other or Dr. Mercie Eon (?) Meds reconciled, pharmacy and allergies verified.

## 2012-12-20 ENCOUNTER — Ambulatory Visit (INDEPENDENT_AMBULATORY_CARE_PROVIDER_SITE_OTHER): Payer: Medicare Other | Admitting: Family Medicine

## 2012-12-20 ENCOUNTER — Telehealth: Payer: Self-pay | Admitting: Family Medicine

## 2012-12-20 ENCOUNTER — Encounter: Payer: Self-pay | Admitting: Family Medicine

## 2012-12-20 VITALS — BP 118/78 | HR 83 | Temp 98.2°F | Ht 63.25 in | Wt 242.4 lb

## 2012-12-20 DIAGNOSIS — I10 Essential (primary) hypertension: Secondary | ICD-10-CM

## 2012-12-20 DIAGNOSIS — E785 Hyperlipidemia, unspecified: Secondary | ICD-10-CM

## 2012-12-20 DIAGNOSIS — J309 Allergic rhinitis, unspecified: Secondary | ICD-10-CM | POA: Insufficient documentation

## 2012-12-20 DIAGNOSIS — Z0000 Encounter for general adult medical examination without abnormal findings: Secondary | ICD-10-CM | POA: Insufficient documentation

## 2012-12-20 LAB — HEPATIC FUNCTION PANEL
ALT: 11 U/L (ref 0–35)
AST: 18 U/L (ref 0–37)
Albumin: 3.8 g/dL (ref 3.5–5.2)
Alkaline Phosphatase: 75 U/L (ref 39–117)
Bilirubin, Direct: 0.1 mg/dL (ref 0.0–0.3)
Total Bilirubin: 0.7 mg/dL (ref 0.3–1.2)
Total Protein: 7.4 g/dL (ref 6.0–8.3)

## 2012-12-20 LAB — CBC WITH DIFFERENTIAL/PLATELET
Basophils Absolute: 0.1 10*3/uL (ref 0.0–0.1)
Basophils Relative: 1.1 % (ref 0.0–3.0)
Eosinophils Absolute: 0.4 10*3/uL (ref 0.0–0.7)
Eosinophils Relative: 3.8 % (ref 0.0–5.0)
HCT: 43.3 % (ref 36.0–46.0)
Hemoglobin: 14.8 g/dL (ref 12.0–15.0)
Lymphocytes Relative: 26.4 % (ref 12.0–46.0)
Lymphs Abs: 2.5 10*3/uL (ref 0.7–4.0)
MCHC: 34.2 g/dL (ref 30.0–36.0)
MCV: 93 fl (ref 78.0–100.0)
Monocytes Absolute: 0.6 10*3/uL (ref 0.1–1.0)
Monocytes Relative: 6.6 % (ref 3.0–12.0)
Neutro Abs: 5.9 10*3/uL (ref 1.4–7.7)
Neutrophils Relative %: 62.1 % (ref 43.0–77.0)
Platelets: 330 10*3/uL (ref 150.0–400.0)
RBC: 4.66 Mil/uL (ref 3.87–5.11)
RDW: 12.9 % (ref 11.5–14.6)
WBC: 9.6 10*3/uL (ref 4.5–10.5)

## 2012-12-20 LAB — BASIC METABOLIC PANEL
BUN: 17 mg/dL (ref 6–23)
CO2: 26 mEq/L (ref 19–32)
Calcium: 9.4 mg/dL (ref 8.4–10.5)
Chloride: 101 mEq/L (ref 96–112)
Creatinine, Ser: 0.9 mg/dL (ref 0.4–1.2)
GFR: 63.01 mL/min (ref >60.00)
Glucose, Bld: 103 mg/dL — ABNORMAL HIGH (ref 70–99)
Potassium: 3.6 mEq/L (ref 3.5–5.1)
Sodium: 136 mEq/L (ref 135–145)

## 2012-12-20 LAB — LIPID PANEL
Cholesterol: 161 mg/dL (ref 0–200)
HDL: 45.4 mg/dL (ref >39.00)
LDL Cholesterol: 84 mg/dL (ref 0–99)
Total CHOL/HDL Ratio: 4
Triglycerides: 156 mg/dL — ABNORMAL HIGH (ref 0.0–149.0)
VLDL: 31.2 mg/dL (ref 0.0–40.0)

## 2012-12-20 MED ORDER — LISINOPRIL 40 MG PO TABS: ORAL_TABLET | ORAL | Status: DC

## 2012-12-20 MED ORDER — HYDROCHLOROTHIAZIDE 12.5 MG PO CAPS: ORAL_CAPSULE | ORAL | Status: DC

## 2012-12-20 MED ORDER — METOPROLOL SUCCINATE ER 200 MG PO TB24: ORAL_TABLET | ORAL | Status: DC

## 2012-12-20 MED ORDER — ATORVASTATIN CALCIUM 10 MG PO TABS: ORAL_TABLET | ORAL | Status: DC

## 2012-12-20 NOTE — Progress Notes (Signed)
  Subjective:    Patient ID: Sarah Burgess, female    DOB: Dec 10, 1940, 72 y.o.   MRN: [id number]  HPI Here today for CPE.  Risk Factors: HTN- chronic problem, excellent control on Lisinopril, HCTZ, metoprolol.  No CP, SOB, HAs, visual changes, edema. Hyperlipidemia- chronic problem, on Lipitor.  Denies abd pain, N/V, myalgias Physical Activity: limited activity but tries to stay busy in the house Fall Risk: low risk Depression: no current sxs Hearing: normal to conversational tones, mildly decreased to whispered voice ADL's: independent Cognitive: normal linear thought process, memory and attention intact Home Safety: safe at home, lives in apt Height, Weight, BMI, Visual Acuity: see vitals, vision corrected to 20/20 w/ glasses Counseling: UTD on mammo and DEXA.  No need for pap due to TAH.  Received call back letter from [facility] (Dr Loreta Ave) Labs Ordered: See A&P Care Plan: See A&P    Review of Systems Patient reports no vision/ hearing changes, adenopathy,fever, weight change,  persistant/recurrent hoarseness , swallowing issues, chest pain, palpitations, edema, persistant/recurrent cough, hemoptysis, dyspnea (rest/exertional/paroxysmal nocturnal), gastrointestinal bleeding (melena, rectal bleeding), abdominal pain, significant heartburn, bowel changes, Gyn symptoms (abnormal  bleeding, pain),  syncope, focal weakness, memory loss, numbness & tingling, skin/hair/nail changes, abnormal bruising or bleeding, anxiety, or depression.  Stress incontinence w/ coughing    Objective:   Physical Exam General Appearance:    Alert, cooperative, no distress, appears stated age  Head:    Normocephalic, without obvious abnormality, atraumatic  Eyes:    PERRL, conjunctiva/corneas clear, EOM's intact, fundi    benign, both eyes  Ears:    Normal TM's and external ear canals, both ears  Nose:   Nares normal, septum midline, mucosal edema and congestion, no drainage or sinus tenderness   Throat:   Lips, mucosa, and tongue normal; teeth and gums normal.  + PND  Neck:   Supple, symmetrical, trachea midline, no adenopathy;    Thyroid: no enlargement/tenderness/nodules  Back:     Symmetric, no curvature, ROM normal, no CVA tenderness  Lungs:     Clear to auscultation bilaterally, respirations unlabored  Chest Wall:    No tenderness or deformity   Heart:    Regular rate and rhythm, S1 and S2 normal, no murmur, rub   or gallop  Breast Exam:    Deferred to mammo  Abdomen:     Soft, non-tender, bowel sounds active all four quadrants,    no masses, no organomegaly  Genitalia:    Deferred at pt's request  Rectal:    Extremities:   Extremities normal, atraumatic, no cyanosis or edema  Pulses:   2+ and symmetric all extremities  Skin:   Skin color, texture, turgor normal, no rashes or lesions  Lymph nodes:   Cervical, supraclavicular, and axillary nodes normal  Neurologic:   CNII-XII intact, normal strength, sensation and reflexes    throughout          Assessment & Plan:

## 2012-12-20 NOTE — Patient Instructions (Addendum)
Follow up in 6 months to recheck BP and cholesterol- sooner if needed Go ahead and schedule your colonoscopy w/ Dr Nicole Cella notify you of your lab results and make any changes if needed Start daily Claritin or Zyrtec for the allergies and post-nasal drip Drink plenty of fluids Call with any questions or concerns Happy Fall!!

## 2012-12-20 NOTE — Assessment & Plan Note (Signed)
Chronic problem.  Well controlled.  Asymptomatic.  Check labs.  No anticipated med changes. 

## 2012-12-20 NOTE — Assessment & Plan Note (Signed)
Chronic problem.  Tolerating statin w/out difficulty.  Check labs.  Adjust meds prn  

## 2012-12-20 NOTE — Assessment & Plan Note (Signed)
New.  Pt's PND, cough, and congestion consistent w/ allergies.  Start OTC antihistamine.  Reviewed supportive care and red flags that should prompt return.  Pt expressed understanding and is in agreement w/ plan.

## 2012-12-20 NOTE — Assessment & Plan Note (Signed)
Pt's PE WNL.  UTD on health maintenance w/ exception of colonoscopy- encouraged her to schedule w/ Dr Loreta Ave as instructed in recall letter.  Check labs.  EKG done- see document for interpretation.  Anticipatory guidance provided.

## 2012-12-20 NOTE — Telephone Encounter (Signed)
12/20/2012  Pt was looking over paperwork while waiting to have her labs drawn today, and noticed that Dr. Pierce Crane is listed as her Hematology and Oncology specialist.  She does not see Dr. Donnie Coffin anymore.  She sees Dr. Ruthann Cancer [facility] [address] at [facility].  She said she was not sure if this was of importance, but wanted to make [place] aware.  Thank you!  bw

## 2012-12-21 ENCOUNTER — Encounter: Payer: Self-pay | Admitting: General Practice

## 2013-01-23 ENCOUNTER — Other Ambulatory Visit: Payer: Self-pay | Admitting: Gastroenterology

## 2013-01-23 DIAGNOSIS — R1310 Dysphagia, unspecified: Secondary | ICD-10-CM

## 2013-01-26 ENCOUNTER — Ambulatory Visit (INDEPENDENT_AMBULATORY_CARE_PROVIDER_SITE_OTHER): Payer: Medicare Other | Admitting: Family Medicine

## 2013-01-26 ENCOUNTER — Encounter: Payer: Self-pay | Admitting: Family Medicine

## 2013-01-26 VITALS — BP 144/82 | HR 86 | Temp 98.1°F | Resp 18 | Wt 244.0 lb

## 2013-01-26 DIAGNOSIS — M25569 Pain in unspecified knee: Secondary | ICD-10-CM

## 2013-01-26 DIAGNOSIS — M25562 Pain in left knee: Secondary | ICD-10-CM | POA: Insufficient documentation

## 2013-01-26 LAB — URIC ACID: Uric Acid, Serum: 6.1 mg/dL (ref 2.4–7.0)

## 2013-01-26 MED ORDER — TRAMADOL HCL 50 MG PO TABS: 50.0000 mg | ORAL_TABLET | Freq: Three times a day (TID) | ORAL | Status: DC | PRN

## 2013-01-26 NOTE — Assessment & Plan Note (Signed)
New.  Severe.  Pt's knee now painful, swollen and warm but no redness.  Will develop large suprapatellar effusions- none present today.  Limited NSAIDs due to GI issues.  Extra strength tylenol and add Ultram prn.  Reviewed supportive care and red flags that should prompt return.  Pt expressed understanding and is in agreement w/ plan.

## 2013-01-26 NOTE — Progress Notes (Signed)
  Subjective:    Patient ID: Sarah Burgess, female    DOB: November 03, 1940, 72 y.o.   MRN: [id number]  HPI L knee pain- sxs started 4-5 weeks ago after getting up from sofa and hearing a 'crackle'.  Pt is having anterior and posterior knee pain.  Reports she will get suprapatellar fluid collection 'that you can see through my pants'.  Fluid collection is not painful but entire knee hurts constantly.  Unable to sleep due to pain.  Difficulty rising from seated position.  Sometimes is unable to walk due to pain.  Has started using a cane.   Review of Systems For ROS see HPI     Objective:   Physical Exam  Vitals reviewed. Constitutional: She appears well-developed and well-nourished. She appears distressed (clearly in pain).  Cardiovascular: Intact distal pulses.   Musculoskeletal: She exhibits tenderness (over L anterior and posterior knee).  Pain w/ L knee flexion/extension Difficulty w/ weight bearing and ambulation- even while using cane + warmth over anterior knee when compared to R, no redness  Skin: Skin is warm and dry. No erythema.          Assessment & Plan:

## 2013-01-26 NOTE — Patient Instructions (Signed)
Follow up as scheduled We'll call you with your ortho appt Continue the extra-strength tylenol Add the Tramadol daily Alternate ice and heat for pain relief Call with any questions or concerns Hang in there! Happy Birthday!

## 2013-02-01 ENCOUNTER — Ambulatory Visit
Admission: RE | Admit: 2013-02-01 | Discharge: 2013-02-01 | Disposition: A | Discharge status: Home / Self Care | Payer: Medicare Other | Source: Ambulatory Visit | Attending: Gastroenterology | Admitting: Gastroenterology

## 2013-02-01 DIAGNOSIS — R1310 Dysphagia, unspecified: Secondary | ICD-10-CM

## 2013-02-27 ENCOUNTER — Ambulatory Visit (INDEPENDENT_AMBULATORY_CARE_PROVIDER_SITE_OTHER): Payer: Medicare Other | Admitting: Surgery

## 2013-02-27 ENCOUNTER — Encounter (INDEPENDENT_AMBULATORY_CARE_PROVIDER_SITE_OTHER): Payer: Self-pay

## 2013-02-27 ENCOUNTER — Encounter (INDEPENDENT_AMBULATORY_CARE_PROVIDER_SITE_OTHER): Payer: Self-pay | Admitting: Surgery

## 2013-02-27 VITALS — BP 130/90 | HR 84 | Temp 98.4°F | Resp 15 | Ht 64.0 in | Wt 240.0 lb

## 2013-02-27 DIAGNOSIS — Z853 Personal history of malignant neoplasm of breast: Secondary | ICD-10-CM

## 2013-02-27 NOTE — Patient Instructions (Signed)
Continue annual mammograms and see [place] in one year

## 2013-02-27 NOTE — Progress Notes (Signed)
02/27/2013  Sarah Burgess is a 72 y.o.female who presents for routine followup of her Left breast cancer diagnosed in 2011 and treated with Lumpectomy, radiation, chemo. She has some tenderness in the left axilla and a lump that was noted on her last visit here.W/O was negative Her port was removed after her last visit PFSH She has had no significant changes since the last visit here.   ROS There have been no significant changes since the last visit here.  General: The patient is alert, oriented, generally healty appearing, NAD. Mood and affect are normal.  Breasts: The right breast is totally normal. The left breast has significant deformity secondary to multiple lumpectomies and radiation. There is no mass, but there is tenderness. There is a rounded noular area in the center of the lumpectomy incision which I think is the area of fat necrosis.The skin shows radiation changes Lymphatics: The right axillary and bilateral supraclavicular areas are normal. There is a 2 cm hard mass in the left axillary area just below her sentinel node incision. It is tender. This remains unchanged  Extremities: Full ROM of the surgical side with no lymphedema noted.  Data Reviewed: Mammogram May, 2014: *RADIOLOGY REPORT*  Clinical Data: Left lumpectomy 2011  DIGITAL DIAGNOSTIC BILATERAL MAMMOGRAM WITH CAD  Comparison: 02/05/2012, 08/10/2011, 11/07/2010, 06/18/2010  Findings:  ACR Breast Density Category 2: There is a scattered fibroglandular  pattern.  Right breast is unchanged and negative. Postsurgical scar with fat  necrosis calcifications middle third depth left breast upper outer  quadrant, stable. No suspicious findings on either side.  Mammographic images were processed with CAD.  IMPRESSION:  Stable benign postoperative change  BI-RADS CATEGORY 2: Benign finding(s).  RECOMMENDATION:  Diagnostic bilateral mammogram in 1 year  I have discussed the findings and recommendations with the  patient.  Results were also provided in writing at the conclusion of the  visit. If applicable, a reminder letter will be sent to the  patient regarding her next appointment.  Original Report Authenticated By: Esperanza Heir, M.D.   Impression: No evidence of recurrent breast cancer in the left breast or problems with the right breast..   Plan: RTC one year, continue annual mammograms

## 2013-04-10 ENCOUNTER — Telehealth: Payer: Self-pay | Admitting: *Deleted

## 2013-04-10 NOTE — Telephone Encounter (Signed)
Patient called and states that she has been experiencing a lot of abdominal pain. Patient states that she has diverticulitis and believes she is having a flare-up. Patient states that she is due to have a colonoscopy on Wed but is going to have to cancel it. Patient scheduled an appointment to see the provider.

## 2013-04-11 ENCOUNTER — Encounter: Payer: Self-pay | Admitting: Family Medicine

## 2013-04-11 ENCOUNTER — Ambulatory Visit (INDEPENDENT_AMBULATORY_CARE_PROVIDER_SITE_OTHER): Payer: Medicare Other | Admitting: Family Medicine

## 2013-04-11 VITALS — BP 128/74 | HR 96 | Temp 98.1°F | Resp 17 | Wt 238.2 lb

## 2013-04-11 DIAGNOSIS — R399 Unspecified symptoms and signs involving the genitourinary system: Secondary | ICD-10-CM

## 2013-04-11 DIAGNOSIS — R102 Pelvic and perineal pain: Secondary | ICD-10-CM | POA: Insufficient documentation

## 2013-04-11 DIAGNOSIS — R109 Unspecified abdominal pain: Secondary | ICD-10-CM

## 2013-04-11 DIAGNOSIS — R3989 Other symptoms and signs involving the genitourinary system: Secondary | ICD-10-CM

## 2013-04-11 LAB — CBC WITH DIFFERENTIAL/PLATELET
Basophils Absolute: 0.1 10*3/uL (ref 0.0–0.1)
Basophils Relative: 0.5 % (ref 0.0–3.0)
Eosinophils Absolute: 0.5 10*3/uL (ref 0.0–0.7)
Eosinophils Relative: 4.6 % (ref 0.0–5.0)
HCT: 45.5 % (ref 36.0–46.0)
Hemoglobin: 15.6 g/dL — ABNORMAL HIGH (ref 12.0–15.0)
Lymphocytes Relative: 27.2 % (ref 12.0–46.0)
Lymphs Abs: 2.9 10*3/uL (ref 0.7–4.0)
MCHC: 34.2 g/dL (ref 30.0–36.0)
MCV: 93.3 fl (ref 78.0–100.0)
Monocytes Absolute: 0.8 10*3/uL (ref 0.1–1.0)
Monocytes Relative: 7.2 % (ref 3.0–12.0)
Neutro Abs: 6.5 10*3/uL (ref 1.4–7.7)
Neutrophils Relative %: 60.5 % (ref 43.0–77.0)
Platelets: 341 10*3/uL (ref 150.0–400.0)
RBC: 4.88 Mil/uL (ref 3.87–5.11)
RDW: 12.4 % (ref 11.5–14.6)
WBC: 10.7 10*3/uL — ABNORMAL HIGH (ref 4.5–10.5)

## 2013-04-11 LAB — POCT URINALYSIS DIPSTICK
Bilirubin, UA: NEGATIVE
Blood, UA: NEGATIVE
Glucose, UA: NEGATIVE
Ketones, UA: NEGATIVE
Leukocytes, UA: NEGATIVE
Nitrite, UA: NEGATIVE
Protein, UA: NEGATIVE
Spec Grav, UA: >=1.03
Urobilinogen, UA: 0.2
pH, UA: 6

## 2013-04-11 NOTE — Progress Notes (Signed)
Pre visit review using our clinic review tool, if applicable. No additional management support is needed unless otherwise documented below in the visit note. 

## 2013-04-11 NOTE — Assessment & Plan Note (Signed)
Check UA and culture if needed- abx if +.

## 2013-04-11 NOTE — Patient Instructions (Signed)
Follow up as needed We'll decide on antibiotics once we look at the labs Drink plenty of fluids Liquid diet until abdominal pain has improved Call with any questions or concerns Hang in there!

## 2013-04-11 NOTE — Progress Notes (Signed)
   Subjective:    Patient ID: Sarah Burgess, female    DOB: Feb 08, 1941, 73 y.o.   MRN: [id number]  HPI abd pain- pt has hx of diverticulitis and has required hospitalization in the past.  Pain started Saturday night, severe pain Sunday.  Pt had Barium swallow 'several weeks ago' and 'my stomach's been in an uproar ever since'.  Was told to take OTC prilosec daily by Dr Collene Mares.  Stopped due to swollen hands/feet and abd cramping.  Pt reports current pain is similar to diverticulitis.  No diarrhea or vomiting.  No dysuria, 'but i cramp real bad prior to peeing'.   Review of Systems For ROS see HPI     Objective:   Physical Exam  Vitals reviewed. Constitutional: She appears well-developed and well-nourished. No distress.  Cardiovascular: Normal rate, regular rhythm and normal heart sounds.   Pulmonary/Chest: Effort normal and breath sounds normal. No respiratory distress. She has no wheezes. She has no rales.  Abdominal: Soft. Bowel sounds are normal. She exhibits no distension. There is no tenderness. There is no rebound and no guarding.          Assessment & Plan:

## 2013-04-11 NOTE — Assessment & Plan Note (Signed)
New.  sxs more consistent w/ UTI than diverticulitis.  Check CBC and if WBC elevated will start abx.  Reviewed supportive care and red flags that should prompt return.  Pt expressed understanding and is in agreement w/ plan.

## 2013-04-12 ENCOUNTER — Other Ambulatory Visit: Payer: Self-pay | Admitting: General Practice

## 2013-04-12 MED ORDER — CIPROFLOXACIN HCL 500 MG PO TABS
500.0000 mg | ORAL_TABLET | Freq: Two times a day (BID) | ORAL | Status: DC
Start: 2013-04-12 — End: 2013-06-20

## 2013-04-12 MED ORDER — METRONIDAZOLE 500 MG PO TABS: 500.0000 mg | ORAL_TABLET | Freq: Three times a day (TID) | ORAL | Status: DC

## 2013-05-29 ENCOUNTER — Telehealth: Payer: Self-pay | Admitting: General Practice

## 2013-05-29 MED ORDER — LISINOPRIL 40 MG PO TABS: ORAL_TABLET | ORAL | Status: DC

## 2013-05-29 MED ORDER — ATORVASTATIN CALCIUM 10 MG PO TABS: ORAL_TABLET | ORAL | Status: DC

## 2013-05-29 MED ORDER — HYDROCHLOROTHIAZIDE 12.5 MG PO CAPS: ORAL_CAPSULE | ORAL | Status: DC

## 2013-05-29 MED ORDER — METOPROLOL SUCCINATE ER 200 MG PO TB24: ORAL_TABLET | ORAL | Status: DC

## 2013-05-29 NOTE — Telephone Encounter (Signed)
Med filled.  

## 2013-06-16 ENCOUNTER — Telehealth: Payer: Self-pay | Admitting: Oncology

## 2013-06-16 NOTE — Telephone Encounter (Signed)
, °

## 2013-06-19 ENCOUNTER — Telehealth: Payer: Self-pay | Admitting: Family Medicine

## 2013-06-19 NOTE — Telephone Encounter (Signed)
06/19/2013  Pt had severe stomach pain starting on 3/26 - 3/29.  Pt took Cipro on 3/28 and 3/29.  Pt states that on the evening of 3/29, they started having brown, slimy stool.  Stomach pain eventually went away and pt feels much better.  Pt just wants to know if they should schedule an appt.  Pt has hx of diverticulitis.

## 2013-06-19 NOTE — Telephone Encounter (Signed)
Spoke with patient.  Patient shared similar story. She stated that she is currently no longer experiencing abdominal pain, but is currently having a malodorous discharge which she suspects is coming from her vagina.  Patient was encouraged to be seen by the provider.  Appointment scheduled for 06/20/13 @ 1100.

## 2013-06-20 ENCOUNTER — Telehealth: Payer: Self-pay | Admitting: Oncology

## 2013-06-20 ENCOUNTER — Encounter: Payer: Self-pay | Admitting: Family Medicine

## 2013-06-20 ENCOUNTER — Other Ambulatory Visit: Payer: Self-pay | Admitting: Oncology

## 2013-06-20 ENCOUNTER — Ambulatory Visit (INDEPENDENT_AMBULATORY_CARE_PROVIDER_SITE_OTHER): Payer: Medicare Other | Admitting: Family Medicine

## 2013-06-20 VITALS — BP 130/80 | HR 97 | Temp 98.2°F | Resp 16 | Wt 234.4 lb

## 2013-06-20 DIAGNOSIS — R82998 Other abnormal findings in urine: Secondary | ICD-10-CM

## 2013-06-20 DIAGNOSIS — N898 Other specified noninflammatory disorders of vagina: Secondary | ICD-10-CM

## 2013-06-20 DIAGNOSIS — R3989 Other symptoms and signs involving the genitourinary system: Secondary | ICD-10-CM

## 2013-06-20 DIAGNOSIS — R319 Hematuria, unspecified: Secondary | ICD-10-CM

## 2013-06-20 DIAGNOSIS — R399 Unspecified symptoms and signs involving the genitourinary system: Secondary | ICD-10-CM

## 2013-06-20 DIAGNOSIS — R109 Unspecified abdominal pain: Secondary | ICD-10-CM

## 2013-06-20 DIAGNOSIS — R822 Biliuria: Secondary | ICD-10-CM

## 2013-06-20 LAB — POCT URINALYSIS DIPSTICK
Glucose, UA: NEGATIVE
Ketones, UA: NEGATIVE
Nitrite, UA: POSITIVE
Protein, UA: 300
Spec Grav, UA: >=1.03
Urobilinogen, UA: 0.2
pH, UA: 6

## 2013-06-20 MED ORDER — CEPHALEXIN 500 MG PO CAPS
500.0000 mg | ORAL_CAPSULE | Freq: Two times a day (BID) | ORAL | Status: AC
Start: 2013-06-20 — End: 2013-06-30

## 2013-06-20 NOTE — Patient Instructions (Signed)
Follow up as needed Start the Keflex for the UTI Drink plenty of fluids We'll notify you of your lab results Someone will call you with your GYN appt to further assess the discharge Call with any questions or concerns Hang in there!

## 2013-06-20 NOTE — Assessment & Plan Note (Signed)
Pt's UA highly suspicious for infection.  + bilirubin present- check LFTs.  Start abx.  Reviewed supportive care and red flags that should prompt return.  Pt expressed understanding and is in agreement w/ plan.

## 2013-06-20 NOTE — Progress Notes (Signed)
   Subjective:    Patient ID: Sarah Burgess Stable, female    DOB: 05-Jun-1940, 73 y.o.   MRN: [id number]  HPI Thursday developed back pain that radiated around to pelvis.  'it felt like diverticulitis it hurt so'.  Saturday and Sunday took 2 Cipro.  Sunday night was standing at sink and 'felt wet'- found 'glob of brownish slimy looking' in underwear.  This continued all night and 'total it's probably a 1/4-1/2 cup'.  As soon as she passed discharge, pelvic pain improved.  No burnng w/ urination but + pressure w/ urination.   Review of Systems For ROS see HPI     Objective:   Physical Exam  Vitals reviewed. Constitutional: She appears well-developed and well-nourished. No distress.  obese  Abdominal: Soft. She exhibits no distension and no mass. There is tenderness (mild suprapubic TTP). There is no rebound and no guarding.  Genitourinary:    There is no rash, tenderness or injury on the right labia. There is no rash, tenderness or injury on the left labia. No bleeding around the vagina. No vaginal discharge found.          Assessment & Plan:

## 2013-06-20 NOTE — Progress Notes (Signed)
Pre visit review using our clinic review tool, if applicable. No additional management support is needed unless otherwise documented below in the visit note. 

## 2013-06-20 NOTE — Assessment & Plan Note (Signed)
New.  Unclear as to what pt was experiencing.  S/p TAH-BSO.  No bleeding or d/c on PE today.  Unclear as to whether pt has a fistula or whether her discharge was some urinary incontinence due to UTI.  Refer to GYN for complete evaluation.  Pt expressed understanding and is in agreement w/ plan.

## 2013-06-20 NOTE — Telephone Encounter (Signed)
APRIL APPTS CXD PER 3/31. S/W PT RE NEW APPTS FOR 4/30 AND 5/7.

## 2013-06-21 LAB — CBC WITH DIFFERENTIAL/PLATELET
Basophils Absolute: 0 10*3/uL (ref 0.0–0.1)
Basophils Relative: 0.2 % (ref 0.0–3.0)
Eosinophils Absolute: 0.3 10*3/uL (ref 0.0–0.7)
Eosinophils Relative: 2.4 % (ref 0.0–5.0)
HCT: 47.7 % — ABNORMAL HIGH (ref 36.0–46.0)
Hemoglobin: 16.3 g/dL — ABNORMAL HIGH (ref 12.0–15.0)
Lymphocytes Relative: 25.4 % (ref 12.0–46.0)
Lymphs Abs: 3.2 10*3/uL (ref 0.7–4.0)
MCHC: 34.2 g/dL (ref 30.0–36.0)
MCV: 93.1 fl (ref 78.0–100.0)
Monocytes Absolute: 0.3 10*3/uL (ref 0.1–1.0)
Monocytes Relative: 2.4 % — ABNORMAL LOW (ref 3.0–12.0)
Neutro Abs: 8.8 10*3/uL — ABNORMAL HIGH (ref 1.4–7.7)
Neutrophils Relative %: 69.6 % (ref 43.0–77.0)
Platelets: 361 10*3/uL (ref 150.0–400.0)
RBC: 5.12 Mil/uL — ABNORMAL HIGH (ref 3.87–5.11)
RDW: 13.1 % (ref 11.5–14.6)
WBC: 12.7 10*3/uL — ABNORMAL HIGH (ref 4.5–10.5)

## 2013-06-21 LAB — HEPATIC FUNCTION PANEL
ALT: 13 U/L (ref 0–35)
AST: 17 U/L (ref 0–37)
Albumin: 3.9 g/dL (ref 3.5–5.2)
Alkaline Phosphatase: 75 U/L (ref 39–117)
Bilirubin, Direct: 0.1 mg/dL (ref 0.0–0.3)
Total Bilirubin: 0.6 mg/dL (ref 0.3–1.2)
Total Protein: 7.8 g/dL (ref 6.0–8.3)

## 2013-06-22 ENCOUNTER — Other Ambulatory Visit: Payer: Self-pay | Admitting: Family Medicine

## 2013-06-22 DIAGNOSIS — R319 Hematuria, unspecified: Secondary | ICD-10-CM

## 2013-06-22 LAB — URINE CULTURE
Colony Count: NO GROWTH
Organism ID, Bacteria: NO GROWTH

## 2013-06-29 ENCOUNTER — Other Ambulatory Visit: Payer: Medicare Other

## 2013-07-14 ENCOUNTER — Other Ambulatory Visit: Payer: Self-pay | Admitting: Family Medicine

## 2013-07-14 DIAGNOSIS — Z853 Personal history of malignant neoplasm of breast: Secondary | ICD-10-CM

## 2013-07-19 ENCOUNTER — Other Ambulatory Visit: Payer: Self-pay | Admitting: *Deleted

## 2013-07-19 DIAGNOSIS — Z853 Personal history of malignant neoplasm of breast: Secondary | ICD-10-CM

## 2013-07-20 ENCOUNTER — Other Ambulatory Visit (HOSPITAL_BASED_OUTPATIENT_CLINIC_OR_DEPARTMENT_OTHER): Payer: Medicare Other

## 2013-07-20 DIAGNOSIS — C50419 Malignant neoplasm of upper-outer quadrant of unspecified female breast: Secondary | ICD-10-CM

## 2013-07-20 DIAGNOSIS — Z853 Personal history of malignant neoplasm of breast: Secondary | ICD-10-CM

## 2013-07-20 LAB — COMPREHENSIVE METABOLIC PANEL (CC13)
ALT: 8 U/L (ref 0–55)
AST: 15 U/L (ref 5–34)
Albumin: 3.6 g/dL (ref 3.5–5.0)
Alkaline Phosphatase: 93 U/L (ref 40–150)
Anion Gap: 13 mEq/L — ABNORMAL HIGH (ref 3–11)
BUN: 21.9 mg/dL (ref 7.0–26.0)
CO2: 24 mEq/L (ref 22–29)
Calcium: 10.4 mg/dL (ref 8.4–10.4)
Chloride: 104 mEq/L (ref 98–109)
Creatinine: 1.1 mg/dL (ref 0.6–1.1)
Glucose: 102 mg/dl (ref 70–140)
Potassium: 3.9 mEq/L (ref 3.5–5.1)
Sodium: 141 mEq/L (ref 136–145)
Total Bilirubin: 0.54 mg/dL (ref 0.20–1.20)
Total Protein: 7.6 g/dL (ref 6.4–8.3)

## 2013-07-20 LAB — CBC WITH DIFFERENTIAL/PLATELET
BASO%: 0.7 % (ref 0.0–2.0)
Basophils Absolute: 0.1 10*3/uL (ref 0.0–0.1)
EOS%: 2.6 % (ref 0.0–7.0)
Eosinophils Absolute: 0.3 10*3/uL (ref 0.0–0.5)
HCT: 45.4 % (ref 34.8–46.6)
HGB: 15.5 g/dL (ref 11.6–15.9)
LYMPH%: 29.5 % (ref 14.0–49.7)
MCH: 31.3 pg (ref 25.1–34.0)
MCHC: 34.1 g/dL (ref 31.5–36.0)
MCV: 91.9 fL (ref 79.5–101.0)
MONO#: 0.7 10*3/uL (ref 0.1–0.9)
MONO%: 7.4 % (ref 0.0–14.0)
NEUT#: 5.7 10*3/uL (ref 1.5–6.5)
NEUT%: 59.8 % (ref 38.4–76.8)
Platelets: 306 10*3/uL (ref 145–400)
RBC: 4.94 10*6/uL (ref 3.70–5.45)
RDW: 13.4 % (ref 11.2–14.5)
WBC: 9.5 10*3/uL (ref 3.9–10.3)
lymph#: 2.8 10*3/uL (ref 0.9–3.3)

## 2013-07-20 LAB — LACTATE DEHYDROGENASE (CC13): LDH: 203 U/L (ref 125–245)

## 2013-07-21 LAB — VITAMIN D 25 HYDROXY (VIT D DEFICIENCY, FRACTURES): Vit D, 25-Hydroxy: 74 ng/mL (ref 30–89)

## 2013-07-27 ENCOUNTER — Telehealth: Payer: Self-pay | Admitting: Oncology

## 2013-07-27 ENCOUNTER — Ambulatory Visit (HOSPITAL_BASED_OUTPATIENT_CLINIC_OR_DEPARTMENT_OTHER): Payer: Medicare Other | Admitting: Hematology and Oncology

## 2013-07-27 VITALS — BP 129/79 | HR 96 | Temp 98.1°F | Resp 20 | Ht 64.0 in | Wt 239.8 lb

## 2013-07-27 DIAGNOSIS — Z853 Personal history of malignant neoplasm of breast: Secondary | ICD-10-CM

## 2013-07-27 DIAGNOSIS — C50419 Malignant neoplasm of upper-outer quadrant of unspecified female breast: Secondary | ICD-10-CM

## 2013-07-27 DIAGNOSIS — M949 Disorder of cartilage, unspecified: Secondary | ICD-10-CM

## 2013-07-27 DIAGNOSIS — M899 Disorder of bone, unspecified: Secondary | ICD-10-CM

## 2013-07-27 NOTE — Progress Notes (Signed)
[place]  [phone]) [phone] [phone]) [phone]  OFFICE PROGRESS NOTE   ID: Sarah Burgess   DOB: [birth year]  MR#: [id number]  OHY#:[id number]   PCP: Annye Asa, MD SU: Haywood Lasso, MD RAD ONC:  Tyler Pita, MD  Chief complaint: follow up visit for breast cancer  HISTORY OF PRESENT ILLNESS: From Dr. Collier Salina Rubin's new patient evaluation dated 06/19/2009:  "This is a delightful 73 year old woman from [facility] referred by Dr. Margot Chimes for evaluation and treatment of breast cancer. This patient has been having some pain in her left breast for sometime.  She had not had a previous mammogram for about three years.  Her primary care doctor is Dr. Coletta Memos.  She was sent for a mammogram and the mammogram showed a mass measuring 2.2 cm in greatest dimension, area of calcifications extending for about 70 cm.  There were scattered benign appearing microcalcifications bilaterally.  Physical exam showed a palpable mass within the left breast 7 cm from the nipple, 2.5 cm by physical exam.  Ultrasound was performed showing irregular hypoechoic mass measuring 1.9 x 1.8 x 1.4 cm, showed normal appearing lymph nodes identified as well, largest measuring 1.3 cm with a loss of fatty hilum.  The patient was referred for a biopsy, which took place on 06/10/2009.  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  This was ER positive 100% and PR positive 4%, proliferative index 19%, HER-2 was non-amplified with a ratio of 1.4.  The patient was referred for possible neoadjuvant therapy.  Of note is that she was due to have an MRI scan, which was cancelled because of concerns regarding her elevated creatinine."  Her subsequent history is as detailed below  INTERVAL HISTORY: There is here for followup visit for her breast cancer today. She's been on tolerating letrozole quite well with minimal side effects of hot flashes. She also complains of left knee pain  and she gets steroid injections from  her arthopedician. Her last mammogram was in May 2014 that revealed no evidence of any malignancy. Her last bone densitometry  was in May 2013 that revealed osteopenia and she is on both calcium and vitamin D supplementation.  She complains of wetness and sticky secretions in her underwear for the past one and half months and  was thoroughly evaluated by gynecology and her Pap smears was negative for any malignancy and also was evaluated by urology and the work up was negative for any etiology. she is scheduled to have the colonoscopy done.  She denies any shortness of breath, palpitations, blood in the stool or blood in the urine. She denies any headaches or blurred vision she thinks she needed to more exercise but not able to do because of left knee pain  REVIEW OF SYSTEMS: A 10 point review of systems was completed and pertinent symptoms are mentioned in interval history  PAST MEDICAL HISTORY: Past Medical History  Diagnosis Date  . Diverticulitis   . Pneumonia   . Thyroid disease   . Hemorrhoids   . Arthritis   . Hernia   . Asthma   . GERD (gastroesophageal reflux disease)   . Allergy   . Cancer     left breast and thyroid   This patient has had a history of hypertension, history of incontinence, and history of hemorrhoids.  PAST SURGICAL HISTORY: Past Surgical History  Procedure Laterality Date  . Cholecystectomy    . Abdominal hysterectomy    . Breast lumpectomy  2011    left breast  . Port-a-cath removal  02/23/2011    Procedure: REMOVAL PORT-A-CATH;  Surgeon: Haywood Lasso, MD;  Location: [facility];  Service: General;  Laterality: N/A;  remove access port right chest  She had a hysterectomy with oophorectomy for vaginal spotting.  She subsequently had pneumonia postoperatively.  FAMILY HISTORY Family History  Problem Relation Age of Onset  . Stroke Mother   . Heart disease Mother   . Early death Mother   .  Diabetes Mother   . Cancer Father     pt unaware of what kind  . Heart disease Father   . Diabetes Father   . Heart disease Sister   . Diabetes Sister   . Arthritis Son   Father died age 73.  Mother died age 101 in 45.  She has no siblings.  GYNECOLOGIC HISTORY: G2, P2, menarche age 38, no history of hormone replacement therapy after hysterectomy.  SOCIAL HISTORY: Ms. Heyne has been a widow for over 15 years.  She has been married twice, her first marriage lasted 31 years, her second marriage lasted for 14 years.  She has two adult children from her first marriage, one son here, one son in [place].  She has 3 adult grandsons.  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  In her spare time she likes to attend church, go to the movies, reading, and watch television.  ADVANCED DIRECTIVES: Not on file  HEALTH MAINTENANCE: History  Substance Use Topics  . Smoking status: Never Smoker   . Smokeless tobacco: Not on file  . Alcohol Use: No    Colonoscopy: Not on file PAP: Not on file Bone density: Last bone density examination on 08/10/2011 showed a T score of -1.6 (osteopenia). Lipid panel: 08/25/2011  Allergies  Allergen Reactions  . Sulfa Drugs Cross Reactors   . Tape     Breaks her out    Current Outpatient Prescriptions  Medication Sig Dispense Refill  . atorvastatin (LIPITOR) 10 MG tablet Take one tablet by mouth one time daily  90 tablet  1  . Calcium Carbonate (CALTRATE 600 PO) Take by mouth 2 (two) times daily.        . Cholecalciferol (VITAMIN D) 2000 UNITS CAPS Take by mouth daily.        . hydrochlorothiazide (MICROZIDE) 12.5 MG capsule Take one capsule by mouth one time daily  90 capsule  1  . letrozole (FEMARA) 2.5 MG tablet Take 1 tablet (2.5 mg total) by mouth daily.  90 tablet  8  . lisinopril (PRINIVIL,ZESTRIL) 40 MG tablet Take one tablet by mouth one time daily  90 tablet  1  . metoprolol (TOPROL-XL) 200 MG 24 hr tablet Take one  tablet by mouth one time daily  90 tablet  1  . ranitidine (ZANTAC) 75 MG tablet Take 75 mg by mouth 2 (two) times daily as needed.        No current facility-administered medications for this visit.    OBJECTIVE: Filed Vitals:   07/27/13 1418  BP: 129/79  Pulse: 96  Temp: 98.1 F (36.7 C)  Resp: 20     Body mass index is 41.14 kg/(m^2).      ECOG FS:  Grade 1 - Symptomatic, but fully ambulatory            General appearance: Alert, cooperative, well nourished, no apparent distress Head: Normocephalic, without obvious abnormality, atraumatic Eyes: Arcus senilis, PERRLA,  EOMI Nose: Nares, septum and mucosa are normal, no drainage or sinus tenderness Neck: Excessive habitus, supple, symmetrical, trachea midline, thyroid not enlarged, no tenderness Resp: Clear to auscultation bilaterally Cardio: Regular rate and rhythm, S1, S2 normal, no murmur, click, rub or gallop Breasts Right breast is pendulous, left breast has well-healed surgical scars, left breast is visibly smaller than the right breast and has radiation changes, left breast is quite glandular and has architectural distortions, no lymphadenopathy, no nipple inversion, no axilla fullness GI: Soft, distended, non-tender, hypoactive bowel sounds, excessive habitus Extremities: Extremities normal, atraumatic, no cyanosis or edema, bilateral lower extremity varicose veins Lymph nodes: Cervical and supraclavicular are normal Neurologic: Grossly normal   LAB RESULTS: Lab Results  Component Value Date   WBC 9.5 07/20/2013   NEUTROABS 5.7 07/20/2013   HGB 15.5 07/20/2013   HCT 45.4 07/20/2013   MCV 91.9 07/20/2013   PLT 306 07/20/2013      Chemistry      Component Value Date/Time   NA 141 07/20/2013 1359   NA 136 12/20/2012 1017   K 3.9 07/20/2013 1359   K 3.6 12/20/2012 1017   CL 101 12/20/2012 1017   CL 102 06/30/2012 1609   CO2 24 07/20/2013 1359   CO2 26 12/20/2012 1017   BUN 21.9 07/20/2013 1359   BUN 17 12/20/2012 1017    CREATININE 1.1 07/20/2013 1359   CREATININE 0.9 12/20/2012 1017      Component Value Date/Time   CALCIUM 10.4 07/20/2013 1359   CALCIUM 9.4 12/20/2012 1017   ALKPHOS 93 07/20/2013 1359   ALKPHOS 75 06/20/2013 1136   AST 15 07/20/2013 1359   AST 17 06/20/2013 1136   ALT 8 07/20/2013 1359   ALT 13 06/20/2013 1136   BILITOT 0.54 07/20/2013 1359   BILITOT 0.6 06/20/2013 1136       Lab Results  Component Value Date   LABCA2 14 01/22/2011    Urinalysis    Component Value Date/Time   COLORURINE AMBER BIOCHEMICALS MAY BE AFFECTED BY COLOR* 10/26/2009 1628   APPEARANCEUR CLOUDY* 10/26/2009 1628   LABSPEC 1.026 10/26/2009 1628   PHURINE 5.0 10/26/2009 1628   GLUCOSEU NEGATIVE 10/26/2009 1628   HGBUR NEGATIVE 10/26/2009 1628   BILIRUBINUR large 06/20/2013 1115   BILIRUBINUR SMALL* 10/26/2009 1628   KETONESUR TRACE* 10/26/2009 1628   PROTEINUR 300 06/20/2013 1115   PROTEINUR 100* 10/26/2009 1628   UROBILINOGEN 0.2 06/20/2013 1115   UROBILINOGEN 1.0 10/26/2009 1628   NITRITE positive 06/20/2013 1115   NITRITE NEGATIVE 10/26/2009 1628   LEUKOCYTESUR large (3+) 06/20/2013 1115    STUDIES: No results found.  ASSESSMENT/PLAN: 73 y.o. [place], [place] woman:  1.  Status post left breast needle core biopsy at the 2:30 position and left axilla biopsy on 06/10/2009 which showed invasive ductal carcinoma and metastatic carcinoma in the axilla area, ER 100%, PR 4%, Ki-67 19%.  2.  Status post left breast lumpectomy with breast excision x 4 and lymph node resection on 07/18/2009 for a stage IIB, pT2 pN1a, 2.2 cm invasive ductal carcinoma with extracellular mucin, multifocal high-grade DCIS with calcifications, focal necrosis and lymphovascular invasion, close margins, grade 2, ER 100%, PR 4%, KI 6719%, HER-2/neu negative by CISH with a ratio of 1.40, 1/8 positive lymph nodes.  3.  Status post chemotherapy with TC x1 cycle on 08/29/2009 resulted in the patient having a brief hospitalization.  Status post chemotherapy  with CMF x1 cycle on 10/17/2009 also resulted in the patient being hospitalized.  4.  Status post left breast needle core biopsy on 12/02/2009 for recurrence of DCIS with calcifications.  5.  Status post radiation therapy from 02/03/2010 through 03/25/2010.  6.  The patient started antiestrogen therapy with Arimidex from 03/2010 through 12/2011.  Her antiestrogen therapy was then changed to Femara from 12/2011 to the present.  7.  Abnormal bilateral digital diagnostic mammogram on 08/10/2011 which showed evidence of a probable fat necrosis in the anterior aspect of the left lumpectomy site.  A 6 month follow up left diagnostic mammogram was recommended.  The patient had a follow up left digital diagnostic mammogram on 02/05/2012 which showed left breast calcifications appeared benign and compatible with fat necrosis, no other suspicious findings within the left breast.  Bilateral digital diagnostic mammogram was recommended in 6 months (07/2012).  8. Bilateral mammogram which was done in May 2014 revealed no evidence of any malignancy. BI-RADS category 2. She is scheduled to have repeat mammogram this month  8.  Osteopenia: Continue calcium with vitamin D supplementation. We'll schedule for her repeat DEXA scan this month  9.  Leukocytosis-resolved     All questions were answered.  The patient was encouraged to contact us in the interim with any problems, questions or concerns.   Wilmon Arms, M.D. Medical oncology  07/27/2013, 3:31 PM

## 2013-07-27 NOTE — Telephone Encounter (Signed)
m °

## 2013-08-15 ENCOUNTER — Encounter (INDEPENDENT_AMBULATORY_CARE_PROVIDER_SITE_OTHER): Payer: Self-pay

## 2013-08-15 ENCOUNTER — Ambulatory Visit
Admission: RE | Admit: 2013-08-15 | Discharge: 2013-08-15 | Disposition: A | Discharge status: Home / Self Care | Payer: Medicare Other | Source: Ambulatory Visit | Attending: Hematology and Oncology | Admitting: Hematology and Oncology

## 2013-08-15 ENCOUNTER — Ambulatory Visit
Admission: RE | Admit: 2013-08-15 | Discharge: 2013-08-15 | Disposition: A | Discharge status: Home / Self Care | Payer: Medicare Other | Source: Ambulatory Visit | Attending: Family Medicine | Admitting: Family Medicine

## 2013-08-15 DIAGNOSIS — Z853 Personal history of malignant neoplasm of breast: Secondary | ICD-10-CM

## 2013-10-02 ENCOUNTER — Telehealth: Payer: Self-pay

## 2013-10-02 DIAGNOSIS — Z853 Personal history of malignant neoplasm of breast: Secondary | ICD-10-CM

## 2013-10-02 MED ORDER — LETROZOLE 2.5 MG PO TABS: 2.5000 mg | ORAL_TABLET | Freq: Every day | ORAL | Status: DC

## 2013-10-02 NOTE — Telephone Encounter (Signed)
Per pt - [facility] unable to get refill for letrozole.  Refilled letrozole while on the phone with pt and confirmed receipt by pharmacy.  Pt voiced understanding.

## 2013-12-11 ENCOUNTER — Telehealth: Payer: Self-pay | Admitting: Family Medicine

## 2013-12-11 NOTE — Telephone Encounter (Signed)
Pt notified and expressed an understanding.  

## 2013-12-11 NOTE — Telephone Encounter (Signed)
Please advise, pt was last seen 05/2013.

## 2013-12-11 NOTE — Telephone Encounter (Signed)
Unfortunately, we cannot call in abx w/o pt being seen.  However, I do know that the pollen counts are very high so she can start Claritin or Zyrtec OTC daily, Mucinex to thin congestion, Robitussin or Delsym for cough.  If no improvement, will need appt for evaluation of infxn

## 2013-12-11 NOTE — Telephone Encounter (Signed)
Pt would like to know if dr. Birdie Riddle would call her in an rx for a sinus infection. Pt states she has no transportation to come in. Pt states she knows its a sinus infection because its seasonal she get it every year same time. [facility] on [address]

## 2013-12-25 ENCOUNTER — Other Ambulatory Visit: Payer: Self-pay | Admitting: Family Medicine

## 2013-12-25 IMAGING — RF DG ESOPHAGUS
11 of 13 series · 19 of 24 positions shown · non-contrast
Comparison: None.

FLUOROSCOPY TIME:  1 min 18 seconds

CLINICAL DATA: Dysphagia

EXAM:
ESOPHOGRAM/BARIUM SWALLOW
TECHNIQUE: Combined double contrast and single contrast examination performed
using effervescent crystals, thick barium liquid, and thin barium
liquid.

[Series 1: run · 6 of 13 slices shown (1 of 11)]
[im 1/13]
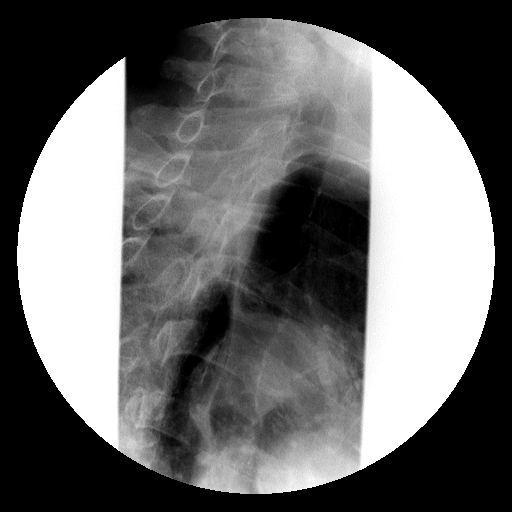
[im 3/13]
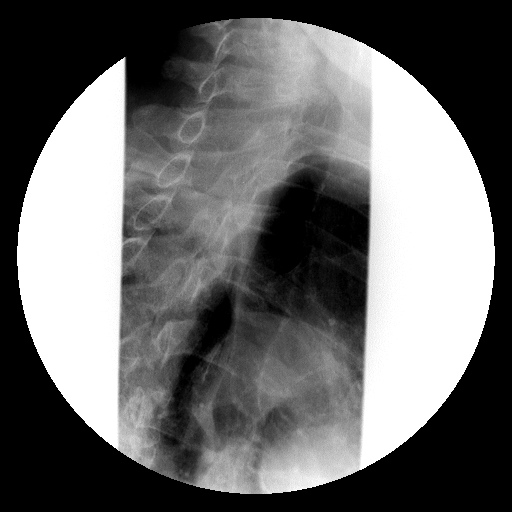
[im 7/13]
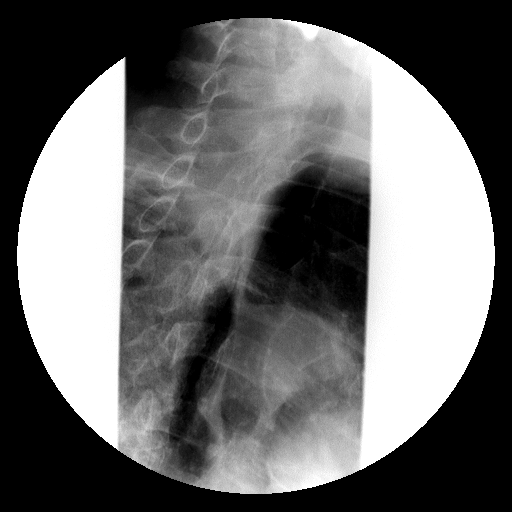
[im 9/13]
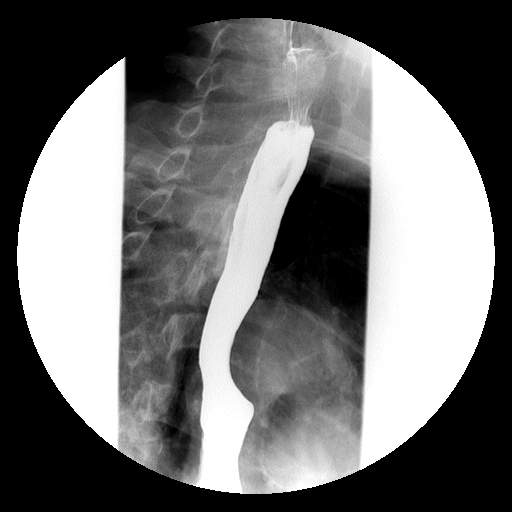
[im 11/13]
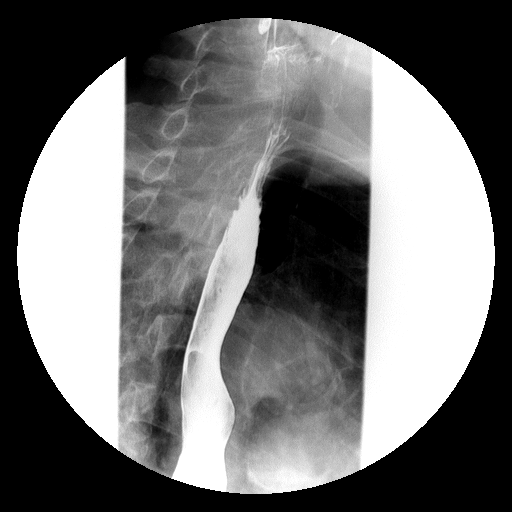
[im 13/13]
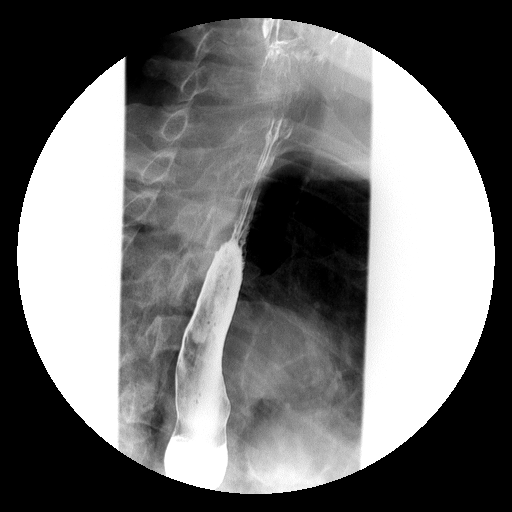

[Series 3: run · 1 of 1 slices shown (2 of 11)]
[im 1/1]
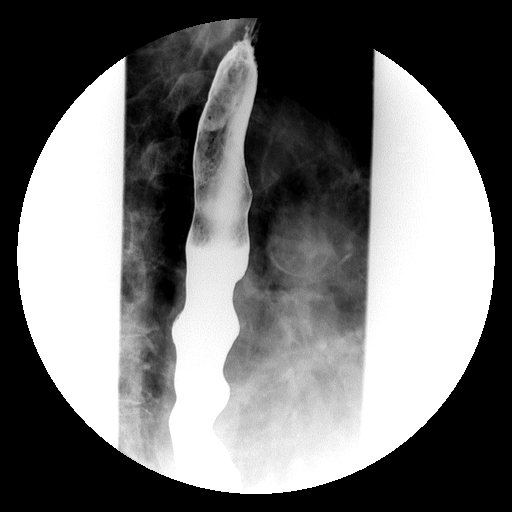

[Series 4: run · 1 of 1 slices shown (3 of 11)]
[im 1/1]
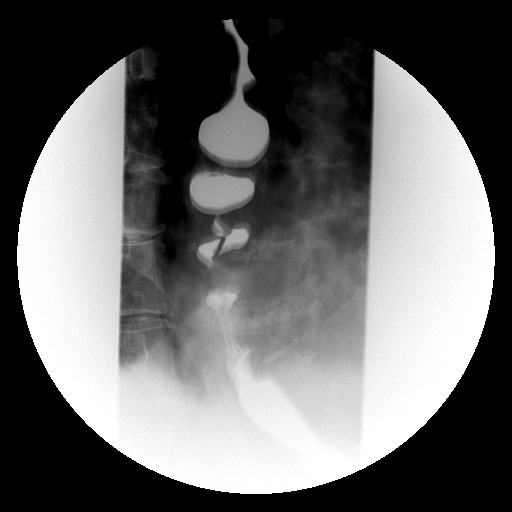

[Series 5: run · 1 of 1 slices shown (4 of 11)]
[im 1/1]
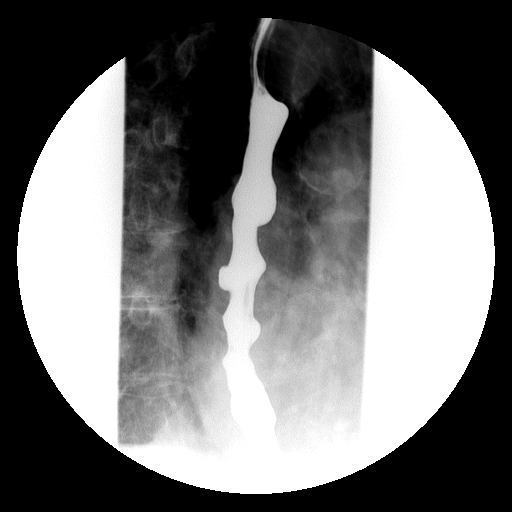

[Series 7: run · 1 of 1 slices shown (5 of 11)]
[im 1/1]
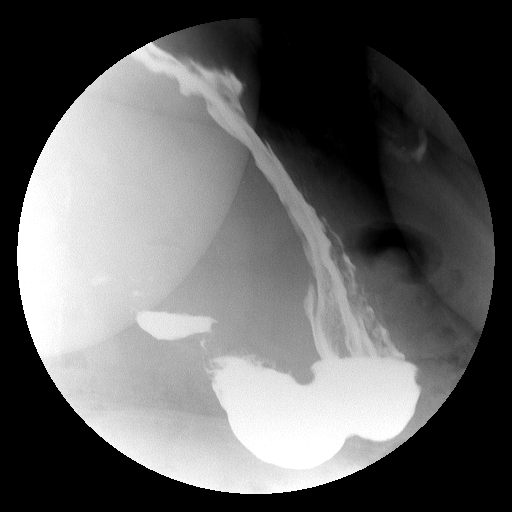

[Series 8: run · 1 of 1 slices shown (6 of 11)]
[im 1/1]
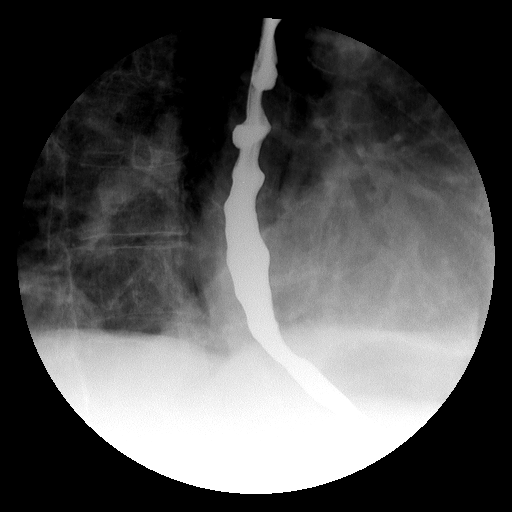

[Series 9: run · 1 of 1 slices shown (7 of 11)]
[im 1/1]
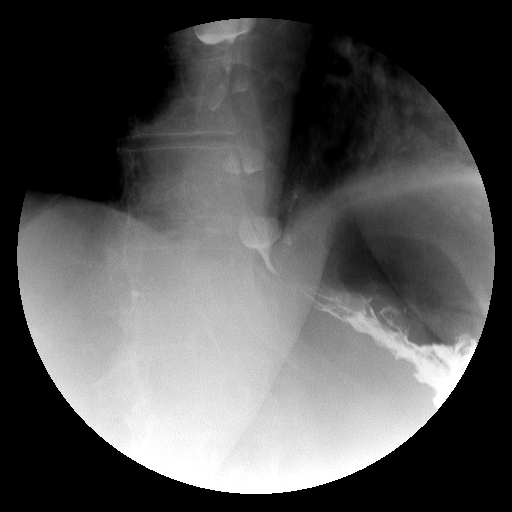

[Series 10: run · 1 of 4 slices shown (8 of 11)]
[im 1/4]
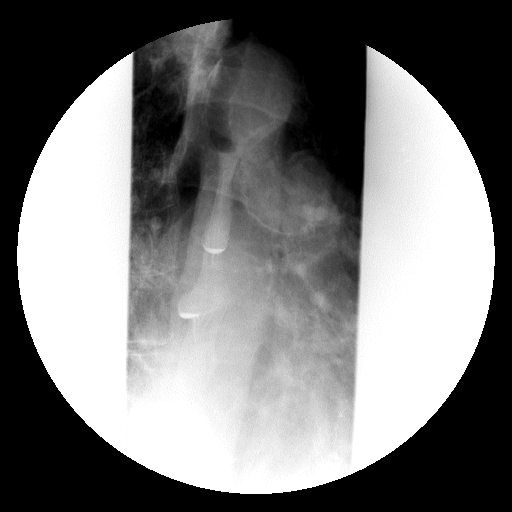

[Series 11: run · 3 of 6 slices shown (9 of 11)]
[im 2/6]
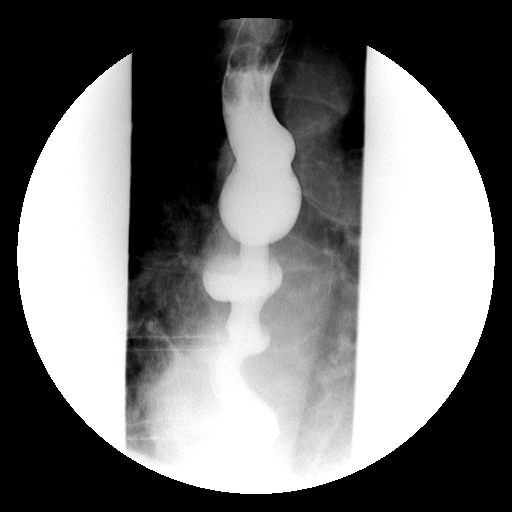
[im 4/6]
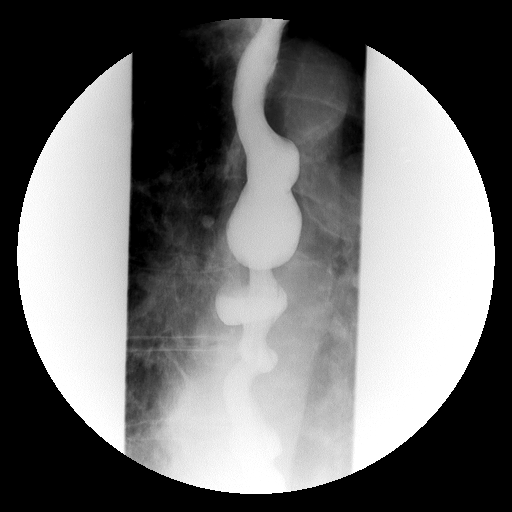
[im 6/6]
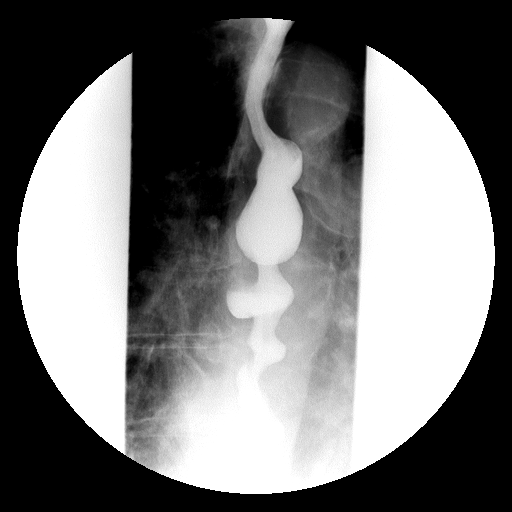

[Series 12: run · 1 of 4 slices shown (10 of 11)]
[im 1/4]
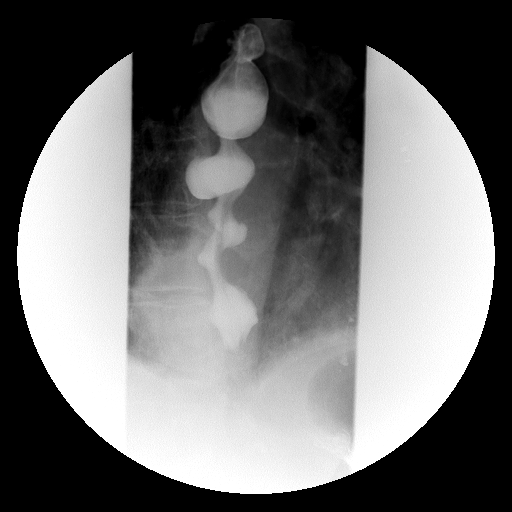

[Series 13: run · 2 of 4 slices shown (11 of 11)]
[im 1/4]
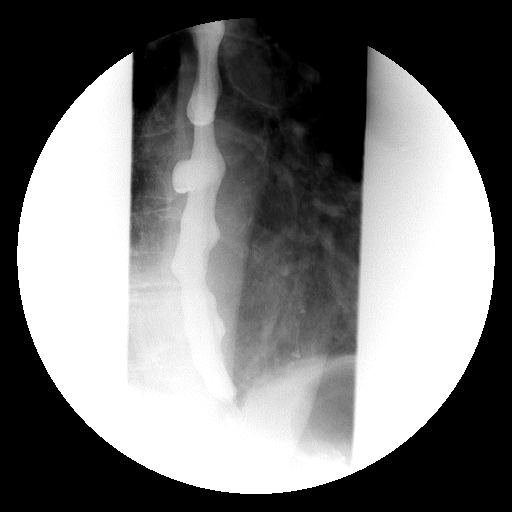
[im 4/4]
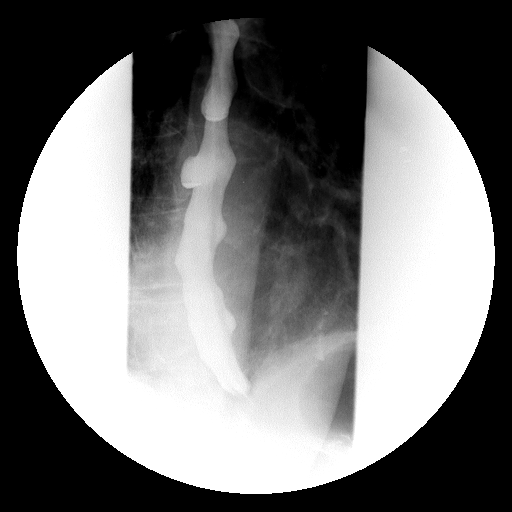

[19 of 24 positions shown; findings below may reference images not displayed]

FINDINGS: Normal esophageal peristalsis within the upper 3rd esophagus.

However, the mid/distal esophagus is notable for moderate to severe
esophageal dysmotility with numerous tertiary/non peristaltic
contractions. Superimposed esophageal spasm is suspected.

No fixed esophageal narrowing or stricture is seen.

The patient was unable to clear her esophagus in the upright
position, and therefore could not be assessed for gastroesophageal
reflux.

Prone imaging was not performed.
IMPRESSION: Moderate to severe esophageal dysmotility with superimposed
esophageal spasm involving the mid/distal esophagus.

## 2013-12-25 NOTE — Telephone Encounter (Signed)
Med filled. Letter mailed to pt.

## 2013-12-26 ENCOUNTER — Other Ambulatory Visit: Payer: Self-pay | Admitting: Family Medicine

## 2013-12-26 NOTE — Telephone Encounter (Signed)
meds filled

## 2014-02-03 ENCOUNTER — Telehealth: Payer: Self-pay

## 2014-02-03 NOTE — Telephone Encounter (Signed)
Reviewing pt chart for AWV for 2015. Notes from [facility]: This patient has been having some pain in her left breast for sometime. She had not had a previous mammogram for about three years. Her primary care doctor is Dr.Bouska.

## 2014-02-03 NOTE — Telephone Encounter (Signed)
LVM for pt to call back.

## 2014-02-04 NOTE — Telephone Encounter (Signed)
She had a diagnostic mammo earlier this year (documented under imaging tab)

## 2014-03-25 ENCOUNTER — Other Ambulatory Visit: Payer: Self-pay | Admitting: Family Medicine

## 2014-03-26 NOTE — Telephone Encounter (Signed)
Med filled, no further refills without an appt 

## 2014-07-05 ENCOUNTER — Other Ambulatory Visit: Payer: Self-pay

## 2014-07-05 DIAGNOSIS — Z9889 Other specified postprocedural states: Secondary | ICD-10-CM

## 2014-07-11 ENCOUNTER — Telehealth: Payer: Self-pay | Admitting: Oncology

## 2014-07-11 NOTE — Telephone Encounter (Signed)
Called and left a message with new appointment time per dr Burnett Harry

## 2014-07-23 ENCOUNTER — Other Ambulatory Visit (HOSPITAL_BASED_OUTPATIENT_CLINIC_OR_DEPARTMENT_OTHER): Payer: Medicare Other

## 2014-07-23 DIAGNOSIS — Z853 Personal history of malignant neoplasm of breast: Secondary | ICD-10-CM | POA: Diagnosis not present

## 2014-07-23 LAB — COMPREHENSIVE METABOLIC PANEL (CC13)
ALT: 12 U/L (ref 0–55)
AST: 15 U/L (ref 5–34)
Albumin: 3.5 g/dL (ref 3.5–5.0)
Alkaline Phosphatase: 90 U/L (ref 40–150)
Anion Gap: 12 mEq/L — ABNORMAL HIGH (ref 3–11)
BUN: 22.8 mg/dL (ref 7.0–26.0)
CO2: 25 mEq/L (ref 22–29)
Calcium: 9.8 mg/dL (ref 8.4–10.4)
Chloride: 103 mEq/L (ref 98–109)
Creatinine: 1 mg/dL (ref 0.6–1.1)
EGFR: 58 mL/min/{1.73_m2} — ABNORMAL LOW (ref >90)
Glucose: 123 mg/dl (ref 70–140)
Potassium: 3.9 mEq/L (ref 3.5–5.1)
Sodium: 140 mEq/L (ref 136–145)
Total Bilirubin: 0.56 mg/dL (ref 0.20–1.20)
Total Protein: 7.1 g/dL (ref 6.4–8.3)

## 2014-07-23 LAB — CBC WITH DIFFERENTIAL/PLATELET
BASO%: 1.9 % (ref 0.0–2.0)
Basophils Absolute: 0.2 10*3/uL — ABNORMAL HIGH (ref 0.0–0.1)
EOS%: 2.7 % (ref 0.0–7.0)
Eosinophils Absolute: 0.3 10*3/uL (ref 0.0–0.5)
HCT: 45.1 % (ref 34.8–46.6)
HGB: 15.1 g/dL (ref 11.6–15.9)
LYMPH%: 29.3 % (ref 14.0–49.7)
MCH: 31.1 pg (ref 25.1–34.0)
MCHC: 33.5 g/dL (ref 31.5–36.0)
MCV: 92.9 fL (ref 79.5–101.0)
MONO#: 0.6 10*3/uL (ref 0.1–0.9)
MONO%: 6.4 % (ref 0.0–14.0)
NEUT#: 5.8 10*3/uL (ref 1.5–6.5)
NEUT%: 59.7 % (ref 38.4–76.8)
Platelets: 297 10*3/uL (ref 145–400)
RBC: 4.86 10*6/uL (ref 3.70–5.45)
RDW: 13 % (ref 11.2–14.5)
WBC: 9.7 10*3/uL (ref 3.9–10.3)
lymph#: 2.8 10*3/uL (ref 0.9–3.3)

## 2014-07-24 NOTE — Progress Notes (Unsigned)
No show  This encounter was created in error - please disregard.

## 2014-07-30 ENCOUNTER — Encounter: Payer: Medicare Other | Admitting: Oncology

## 2014-08-21 ENCOUNTER — Telehealth: Payer: Self-pay | Admitting: Oncology

## 2014-08-21 ENCOUNTER — Ambulatory Visit (HOSPITAL_BASED_OUTPATIENT_CLINIC_OR_DEPARTMENT_OTHER): Payer: Medicare Other | Admitting: Oncology

## 2014-08-21 ENCOUNTER — Other Ambulatory Visit: Payer: Self-pay | Admitting: Oncology

## 2014-08-21 VITALS — BP 109/62 | HR 78 | Temp 97.5°F | Resp 18 | Ht 64.0 in | Wt 243.6 lb

## 2014-08-21 DIAGNOSIS — Z9889 Other specified postprocedural states: Secondary | ICD-10-CM

## 2014-08-21 DIAGNOSIS — C50419 Malignant neoplasm of upper-outer quadrant of unspecified female breast: Secondary | ICD-10-CM | POA: Diagnosis not present

## 2014-08-21 DIAGNOSIS — G4700 Insomnia, unspecified: Secondary | ICD-10-CM

## 2014-08-21 DIAGNOSIS — M1990 Unspecified osteoarthritis, unspecified site: Secondary | ICD-10-CM | POA: Diagnosis not present

## 2014-08-21 DIAGNOSIS — I4892 Unspecified atrial flutter: Secondary | ICD-10-CM

## 2014-08-21 NOTE — Telephone Encounter (Signed)
Appointments made and avs printed for patient she is established with dr Wynonia Lawman and will call for an appointment

## 2014-08-21 NOTE — Progress Notes (Signed)
[place]  [phone] [phone]  OFFICE PROGRESS NOTE   ID: Sarah Burgess   DOB: 03-20-1941  MR#: [id number]  GNF#:[id number]   QIO:[id number] E, MD SU: Sarah Lasso, MD RAD ONC:  Sarah Pita, MD, Sarah Eth MD  Chief complaint: Estrogen receptor positive breast cancer  Current treatment: Letrozole    HISTORY OF PRESENT ILLNESS: From Dr. Collier Salina Burgess's new patient evaluation dated 06/19/2009:  "This is a delightful 74 year old woman from [facility] referred by Dr. Margot Burgess for evaluation and treatment of breast cancer. --This patient has been having some pain in her left breast for sometime.  She had not had a previous mammogram for about three years.  Her primary care doctor is Sarah Burgess.  She was sent for a mammogram and the mammogram showed a mass measuring 2.2 cm in greatest dimension, area of calcifications extending for about 70 cm.  There were scattered benign appearing microcalcifications bilaterally.  Physical exam showed a palpable mass within the left breast 7 cm from the nipple, 2.5 cm by physical exam.  Ultrasound was performed showing irregular hypoechoic mass measuring 1.9 x 1.8 x 1.4 cm, showed normal appearing lymph nodes identified as well, largest measuring 1.3 cm with a loss of fatty hilum.  The patient was referred for a biopsy, which took place on 06/10/2009.  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  This was ER positive 100% and PR positive 4%, proliferative index 19%, HER-2 was non-amplified with a ratio of 1.4.  The patient was referred for possible neoadjuvant therapy.  Of note is that she was due to have an MRI scan, which was cancelled because of concerns regarding her elevated creatinine."    Her subsequent history is as detailed below  INTERVAL HISTORY: Sarah Burgess returns today for follow-up of her estrogen receptor positive breast cancer. She continues on letrozole,  generally with good tolerance. She does have hot flashes and some night sweats as well. She obtains it at a good price.  REVIEW OF SYSTEMS: She has significant insomnia problem. She has trouble going to sleep, and then she wakes up several times. She will then sleep to 10 or 11 in the morning. Sometimes she naps during the day. Sometimes she doesn't sleep at night at all. Second problem is that her heart can flutter. When that happens she feels lightheaded. She sits down and it can be may be tender 15 minutes before things quieted down there she tells me she has not discussed this with her primary care physician. She also has back pain. This is worse when she stands for any period of time. It doesn't hurt when she is sitting or lying. It takes her to her 3 hours to clean up her yard where as before she could do it in 30 minutes. She has joint aches here in there which are not more persistent or intense than before. Her nares are also back. Her son is in [place], in and out of the hospital, with multiple medical problems. The place where she lives is not well managed, there is no stable population, and everybody's constantly moving in and out. The place is not kept up well.  Aside from these issues a detailed review of systems today was noncontributory   PAST MEDICAL HISTORY: Past Medical History  Diagnosis Date  . Diverticulitis   . Pneumonia   . Thyroid disease   . Hemorrhoids   . Arthritis   . Hernia   .  Asthma   . GERD (gastroesophageal reflux disease)   . Allergy   . Cancer     left breast and thyroid   This patient has had a history of hypertension, history of incontinence, and history of hemorrhoids.  PAST SURGICAL HISTORY: Past Surgical History  Procedure Laterality Date  . Cholecystectomy    . Abdominal hysterectomy    . Breast lumpectomy  2011    left breast  . Port-a-cath removal  02/23/2011    Procedure: REMOVAL PORT-A-CATH;  Surgeon: Sarah Lasso, MD;  Location: [place];  Service: General;  Laterality: N/A;  remove access port right chest  She had a hysterectomy with oophorectomy for vaginal spotting.  She subsequently had pneumonia postoperatively.  FAMILY HISTORY Family History  Problem Relation Age of Onset  . Stroke Mother   . Heart disease Mother   . Early death Mother   . Diabetes Mother   . Cancer Father     pt unaware of what kind  . Heart disease Father   . Diabetes Father   . Heart disease Sister   . Diabetes Sister   . Arthritis Son   Father died age 73.  Mother died age 61 in 18.  She has no siblings.  GYNECOLOGIC HISTORY: G2, P2, menarche age 54, no history of hormone replacement therapy after hysterectomy.  SOCIAL HISTORY: Sarah. Burgess has been a widow for over 15 years.  She has been married twice, her first marriage lasted 67 years, her second marriage lasted for 14 years.  She has two adult children from her first marriage, one son here, one son in [place].  She has 3 adult grandsons.  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  In her spare time she likes to attend church, go to the movies, reading, and watch television.  ADVANCED DIRECTIVES: Not on file  HEALTH MAINTENANCE: History  Substance Use Topics  . Smoking status: Never Smoker   . Smokeless tobacco: Not on file  . Alcohol Use: No    Colonoscopy: AUG 2015 PAP: Not on file Bone density: Last bone density examination on 08/10/2011 showed a T score of -1.6 (osteopenia). Lipid panel: 08/25/2011  Allergies  Allergen Reactions  . Sulfa Drugs Cross Reactors   . Tape     Breaks her out    Current Outpatient Prescriptions  Medication Sig Dispense Refill  . atorvastatin (LIPITOR) 10 MG tablet TAKE ONE TABLET BY MOUTH ONE TIME DAILY  90 tablet 0  . Calcium Carbonate (CALTRATE 600 PO) Take by mouth 2 (two) times daily.      . Cholecalciferol (VITAMIN D) 2000 UNITS CAPS Take by mouth daily.      . hydrochlorothiazide  (MICROZIDE) 12.5 MG capsule TAKE ONE CAPSULE BY MOUTH ONE TIME DAILY  90 capsule 0  . letrozole (FEMARA) 2.5 MG tablet Take 1 tablet (2.5 mg total) by mouth daily. 90 tablet 8  . lisinopril (PRINIVIL,ZESTRIL) 40 MG tablet TAKE ONE TABLET BY MOUTH ONE TIME DAILY  90 tablet 0  . metoprolol (TOPROL-XL) 200 MG 24 hr tablet TAKE ONE TABLET BY MOUTH ONE TIME DAILY  90 tablet 0  . ranitidine (ZANTAC) 75 MG tablet Take 75 mg by mouth 2 (two) times daily as needed.      No current facility-administered medications for this visit.    OBJECTIVE: Older white woman who appears stated age 58 Vitals:   08/21/14 1002  BP: 109/62  Pulse: 78  Temp:  97.5 F (36.4 C)  Resp: 18     Body mass index is 41.79 kg/(m^2).      ECOG FS:  Grade 1            Sclerae unicteric, EOMs intact Oropharynx clear, dentition in poor repair No cervical or supraclavicular adenopathy Lungs no rales or rhonchi Heart regular rate and rhythm, no murmur appreciated Abd soft, obese, nontender, positive bowel sounds MSK no focal spinal tenderness Neuro: nonfocal, well oriented, appropriate affect Breasts: The right breast is unremarkable. The left breast is status post lumpectomy and radiation. There is no evidence of local recurrence. The left axilla is benign Skin: The patient has a gash on her left dorsal forearm, with some erythema, but no evidence of infection  LAB RESULTS: Lab Results  Component Value Date   WBC 9.7 07/23/2014   NEUTROABS 5.8 07/23/2014   HGB 15.1 07/23/2014   HCT 45.1 07/23/2014   MCV 92.9 07/23/2014   PLT 297 07/23/2014      Chemistry      Component Value Date/Time   NA 140 07/23/2014 1337   NA 136 12/20/2012 1017   K 3.9 07/23/2014 1337   K 3.6 12/20/2012 1017   CL 101 12/20/2012 1017   CL 102 06/30/2012 1609   CO2 25 07/23/2014 1337   CO2 26 12/20/2012 1017   BUN 22.8 07/23/2014 1337   BUN 17 12/20/2012 1017   CREATININE 1.0 07/23/2014 1337   CREATININE 0.9 12/20/2012 1017       Component Value Date/Time   CALCIUM 9.8 07/23/2014 1337   CALCIUM 9.4 12/20/2012 1017   ALKPHOS 90 07/23/2014 1337   ALKPHOS 75 06/20/2013 1136   AST 15 07/23/2014 1337   AST 17 06/20/2013 1136   ALT 12 07/23/2014 1337   ALT 13 06/20/2013 1136   BILITOT 0.56 07/23/2014 1337   BILITOT 0.6 06/20/2013 1136       Lab Results  Component Value Date   LABCA2 14 01/22/2011    Urinalysis    Component Value Date/Time   COLORURINE AMBER BIOCHEMICALS MAY BE AFFECTED BY COLOR* 10/26/2009 1628   APPEARANCEUR CLOUDY* 10/26/2009 1628   LABSPEC 1.026 10/26/2009 1628   PHURINE 5.0 10/26/2009 1628   GLUCOSEU NEGATIVE 10/26/2009 1628   HGBUR NEGATIVE 10/26/2009 1628   BILIRUBINUR large 06/20/2013 1115   BILIRUBINUR SMALL* 10/26/2009 1628   KETONESUR TRACE* 10/26/2009 1628   PROTEINUR 300 06/20/2013 1115   PROTEINUR 100* 10/26/2009 1628   UROBILINOGEN 0.2 06/20/2013 1115   UROBILINOGEN 1.0 10/26/2009 1628   NITRITE positive 06/20/2013 1115   NITRITE NEGATIVE 10/26/2009 1628   LEUKOCYTESUR large (3+) 06/20/2013 1115    STUDIES: Repeat mammography pending tomorrow   ASSESSMENT/PLAN: 74 y.o. [place] woman: : 1.  Status post left breast needle core biopsy at the 2:30 position and left axilla biopsy on 06/10/2009 which showed invasive ductal carcinoma and metastatic carcinoma in the axilla area, ER 100%, PR 4%, Ki-67 19%.  2.  Status post left breast lumpectomy with breast excision x 4 and lymph node resection on 07/18/2009 for a stage IIB, pT2 pN1a, 2.2 cm invasive ductal carcinoma with extracellular mucin, multifocal high-grade DCIS with calcifications, focal necrosis and lymphovascular invasion, close margins, grade 2, ER 100%, PR 4%, KI 6719%, HER-2/neu negative by CISH with a ratio of 1.40, 1/8 positive lymph nodes.  3.  Status post chemotherapy with TC x1 cycle on 08/29/2009 resulted in the patient having a brief hospitalization.  Status post chemotherapy with CMF x1  cycle on  10/17/2009 also resulted in the patient being hospitalized.  4.  Status post left breast needle core biopsy on 12/02/2009 for recurrence of DCIS with calcifications.  5.  Status post radiation therapy from 02/03/2010 through 03/25/2010.  6.  The patient started antiestrogen therapy with Arimidex from 03/2010 through 12/2011.  Her antiestrogen therapy was then changed to Femara from 12/2011 to the present.  7.  Abnormal bilateral digital diagnostic mammogram on 08/10/2011 which showed evidence of a probable fat necrosis in the anterior aspect of the left lumpectomy site.  A 6 month follow up left diagnostic mammogram was recommended.  The patient had a follow up left digital diagnostic mammogram on 02/05/2012 which showed left breast calcifications appeared benign and compatible with fat necrosis, no other suspicious findings within the left breast.  Bilateral digital diagnostic mammogram was recommended in 6 months (07/2012).  8. Bilateral mammogram which was done in May 2014 revealed no evidence of any malignancy. BI-RADS category 2. She is scheduled to have repeat mammogram 08/22/2014  8.  Osteopenia: Continue calcium with vitamin D supplementation. We'll schedule for her repeat DEXA scan this month  PLAN: Sarah Spegal has a variety of problems, but I don't think her breast cancer or its treatment is related to any of them. We discussed insomnia at length. I suggested she always make sure to wake up at the same time every day, since that is the only thing she can control. She can take Tylenol PM as was suggested by her primary care physician, and she can keep the room cold. Hopefully if she sticks to those suggestions her insomnia problem will gradually improve.  She has low back pain and joint pain here in there which is going to be related to arthritis. I think if she were more active that would help and [facility] and [facility]  [facility].  She does have a history of what she calls flutters. There is no angina. She says she gets lightheaded when that happens that it can take 10 or 15 minutes to get through it. She has seen Dr. Wynonia Lawman in the past. Perhaps it would be a good idea for him to reevaluate her with this problem in mind.  Otherwise I will see her one last time January 2012. She will stop letrozole then, and "graduate" from follow-up here.    Chauncey Cruel, MD  Medical oncology  08/21/2014, 10:15 AM

## 2014-08-22 ENCOUNTER — Ambulatory Visit
Admission: RE | Admit: 2014-08-22 | Discharge: 2014-08-22 | Disposition: A | Discharge status: Home / Self Care | Payer: Medicare Other | Source: Ambulatory Visit

## 2014-08-22 DIAGNOSIS — Z9889 Other specified postprocedural states: Secondary | ICD-10-CM

## 2014-12-20 ENCOUNTER — Other Ambulatory Visit: Payer: Self-pay | Admitting: Oncology

## 2015-04-08 ENCOUNTER — Other Ambulatory Visit: Payer: Self-pay

## 2015-04-08 DIAGNOSIS — R2230 Localized swelling, mass and lump, unspecified upper limb: Secondary | ICD-10-CM

## 2015-04-08 DIAGNOSIS — Z853 Personal history of malignant neoplasm of breast: Secondary | ICD-10-CM

## 2015-04-09 ENCOUNTER — Other Ambulatory Visit: Payer: Medicare Other

## 2015-04-16 ENCOUNTER — Telehealth: Payer: Self-pay | Admitting: Oncology

## 2015-04-16 ENCOUNTER — Ambulatory Visit (HOSPITAL_BASED_OUTPATIENT_CLINIC_OR_DEPARTMENT_OTHER): Payer: Medicare Other | Admitting: Oncology

## 2015-04-16 VITALS — BP 140/74 | HR 66 | Temp 97.5°F | Resp 22 | Ht 64.0 in | Wt 242.4 lb

## 2015-04-16 DIAGNOSIS — C50812 Malignant neoplasm of overlapping sites of left female breast: Secondary | ICD-10-CM | POA: Diagnosis not present

## 2015-04-16 DIAGNOSIS — M810 Age-related osteoporosis without current pathological fracture: Secondary | ICD-10-CM

## 2015-04-16 DIAGNOSIS — C773 Secondary and unspecified malignant neoplasm of axilla and upper limb lymph nodes: Secondary | ICD-10-CM | POA: Diagnosis not present

## 2015-04-16 NOTE — Telephone Encounter (Signed)
Appointments made and avs printed °

## 2015-04-16 NOTE — Progress Notes (Signed)
[place]  [phone] [phone]) [phone]  OFFICE PROGRESS NOTE   ID: Sarah Burgess   DOB: 01-11-1941  MR#: [id number]  AGT#:[id number]   YYQ:MGNOIB,BCWUG E, MD SU: Sarah Lasso, MD RAD ONC:  Sarah Pita, MD, Sarah Eth MD  Chief complaint: Estrogen receptor positive breast cancer  Current treatment: Letrozole    HISTORY OF PRESENT ILLNESS: From Dr. Collier Salina Burgess's new patient evaluation dated 06/19/2009:  "This is a delightful 75 year old woman from [facility] referred by Dr. Margot Burgess for evaluation and treatment of breast cancer. --This patient has been having some pain in her left breast for sometime.  She had not had a previous mammogram for about three years.  Her primary care doctor is Dr. Coletta Sarah Burgess.  She was sent for a mammogram and the mammogram showed a mass measuring 2.2 cm in greatest dimension, area of calcifications extending for about 70 cm.  There were scattered benign appearing microcalcifications bilaterally.  Physical exam showed a palpable mass within the left breast 7 cm from the nipple, 2.5 cm by physical exam.  Ultrasound was performed showing irregular hypoechoic mass measuring 1.9 x 1.8 x 1.4 cm, showed normal appearing lymph nodes identified as well, largest measuring 1.3 cm with a loss of fatty hilum.  The patient was referred for a biopsy, which took place on 06/10/2009.  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  This was ER positive 100% and PR positive 4%, proliferative index 19%, HER-2 was non-amplified with a ratio of 1.4.  The patient was referred for possible neoadjuvant therapy.  Of note is that she was due to have an MRI scan, which was cancelled because of concerns regarding her elevated creatinine."    Her subsequent history is as detailed below  INTERVAL HISTORY: Sarah Burgess returns today for follow-up of her breast cancer. She is completing 5 years of antiestrogen therapy this month. She has  tolerated the letrozole remarkably well. She still has hot flashes though. Vaginal dryness is not an issue.  REVIEW OF SYSTEMS: Sarah Burgess continues to have insomnia problems. She gets up but very irregular at times, sometimes staying in bed his latest noon, other times getting up at 7. Then she goes to bed very late and can't sleep. She describes herself is very fatigue and says she could sleep all day. She is not exercising. She has pain in her back arms and legs. This is not more intense or persistent than before. She has sinus problems and a little bit of a cough, productive of clear phlegm. She feels horse. Just stress urinary incontinence. A detailed review of systems today was otherwise stable  PAST MEDICAL HISTORY: Past Medical History  Diagnosis Date  . Diverticulitis   . Pneumonia   . Thyroid disease   . Hemorrhoids   . Arthritis   . Hernia   . Asthma   . GERD (gastroesophageal reflux disease)   . Allergy   . Cancer     left breast and thyroid   This patient has had a history of hypertension, history of incontinence, and history of hemorrhoids.  PAST SURGICAL HISTORY: Past Surgical History  Procedure Laterality Date  . Cholecystectomy    . Abdominal hysterectomy    . Breast lumpectomy  2011    left breast  . Port-a-cath removal  02/23/2011    Procedure: REMOVAL PORT-A-CATH;  Surgeon: Sarah Lasso, MD;  Location: [facility];  Service: General;  Laterality: N/A;  remove access port right  chest  She had a hysterectomy with oophorectomy for vaginal spotting.  She subsequently had pneumonia postoperatively.  FAMILY HISTORY Family History  Problem Relation Age of Onset  . Stroke Mother   . Heart disease Mother   . Early death Mother   . Diabetes Mother   . Cancer Father     pt unaware of what kind  . Heart disease Father   . Diabetes Father   . Heart disease Sister   . Diabetes Sister   . Arthritis Son   Father died age 67.  Mother died age 68 in [place].   She has no siblings.  GYNECOLOGIC HISTORY: G2, P2, menarche age 79, no history of hormone replacement therapy after hysterectomy.  SOCIAL HISTORY: Ms. Sarah Burgess has been a widow for over 15+ years.  She was married twice, her first marriage lasted 93 years, her second marriage lasted for 14 years.  She has two adult children from her first marriage, one son here, one son in [place]. The son in [place] has rheumatoid arthritis. She has 3 adult grandsons.  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  In her spare time she likes to attend church, go to the movies, reading, and watch television.  ADVANCED DIRECTIVES: Not on file  HEALTH MAINTENANCE: Social History  Substance Use Topics  . Smoking status: Never Smoker   . Smokeless tobacco: Not on file  . Alcohol Use: No    Colonoscopy: AUG 2015 PAP: Not on file Bone density: Last bone density examination on 08/10/2011 showed a T score of -1.6 (osteopenia). Lipid panel: 08/25/2011  Allergies  Allergen Reactions  . Sulfa Drugs Cross Reactors   . Tape     Breaks her out    Current Outpatient Prescriptions  Medication Sig Dispense Refill  . atorvastatin (LIPITOR) 10 MG tablet TAKE ONE TABLET BY MOUTH ONE TIME DAILY  90 tablet 0  . Calcium Carbonate (CALTRATE 600 PO) Take by mouth 2 (two) times daily.      . hydrochlorothiazide (MICROZIDE) 12.5 MG capsule TAKE ONE CAPSULE BY MOUTH ONE TIME DAILY  90 capsule 0  . letrozole (FEMARA) 2.5 MG tablet TAKE ONE TABLET BY MOUTH ONE TIME DAILY 90 tablet 4  . lisinopril (PRINIVIL,ZESTRIL) 40 MG tablet TAKE ONE TABLET BY MOUTH ONE TIME DAILY  90 tablet 0  . metoprolol (TOPROL-XL) 200 MG 24 hr tablet TAKE ONE TABLET BY MOUTH ONE TIME DAILY  90 tablet 0  . ranitidine (ZANTAC) 75 MG tablet Take 75 mg by mouth 2 (two) times daily as needed.      No current facility-administered medications for this visit.    OBJECTIVE: Older white woman in no acute distress Filed Vitals:    04/16/15 1126  BP: 140/74  Pulse: 66  Temp: 97.5 F (36.4 C)  Resp: 22     Body mass index is 41.59 kg/(m^2).      ECOG FS:  2            Sclerae unicteric, pupils round and equal Oropharynx clear and moist-- no thrush or other lesions No cervical or supraclavicular adenopathy Lungs no rales or rhonchi Heart regular rate and rhythm Abd soft, obese, nontender, positive bowel sounds MSK no focal spinal tenderness, no upper extremity lymphedema Neuro: nonfocal, well oriented, appropriate affect Breasts: the right breast is unremarkable. The left breast is status post lumpectomy and radiation. There is some deformity of the breast contour. There is no evidence of local recurrence. The  left axilla is benign   LAB RESULTS: Lab Results  Component Value Date   WBC 9.7 07/23/2014   NEUTROABS 5.8 07/23/2014   HGB 15.1 07/23/2014   HCT 45.1 07/23/2014   MCV 92.9 07/23/2014   PLT 297 07/23/2014      Chemistry      Component Value Date/Time   NA 140 07/23/2014 1337   NA 136 12/20/2012 1017   K 3.9 07/23/2014 1337   K 3.6 12/20/2012 1017   CL 101 12/20/2012 1017   CL 102 06/30/2012 1609   CO2 25 07/23/2014 1337   CO2 26 12/20/2012 1017   BUN 22.8 07/23/2014 1337   BUN 17 12/20/2012 1017   CREATININE 1.0 07/23/2014 1337   CREATININE 0.9 12/20/2012 1017      Component Value Date/Time   CALCIUM 9.8 07/23/2014 1337   CALCIUM 9.4 12/20/2012 1017   ALKPHOS 90 07/23/2014 1337   ALKPHOS 75 06/20/2013 1136   AST 15 07/23/2014 1337   AST 17 06/20/2013 1136   ALT 12 07/23/2014 1337   ALT 13 06/20/2013 1136   BILITOT 0.56 07/23/2014 1337   BILITOT 0.6 06/20/2013 1136       Lab Results  Component Value Date   LABCA2 14 01/22/2011    Urinalysis    Component Value Date/Time   COLORURINE AMBER BIOCHEMICALS MAY BE AFFECTED BY COLOR* 10/26/2009 1628   APPEARANCEUR CLOUDY* 10/26/2009 1628   LABSPEC 1.026 10/26/2009 1628   PHURINE 5.0 10/26/2009 1628   GLUCOSEU NEGATIVE  10/26/2009 1628   HGBUR NEGATIVE 10/26/2009 1628   BILIRUBINUR large 06/20/2013 1115   BILIRUBINUR SMALL* 10/26/2009 1628   KETONESUR TRACE* 10/26/2009 1628   PROTEINUR 300 06/20/2013 1115   PROTEINUR 100* 10/26/2009 1628   UROBILINOGEN 0.2 06/20/2013 1115   UROBILINOGEN 1.0 10/26/2009 1628   NITRITE positive 06/20/2013 1115   NITRITE NEGATIVE 10/26/2009 1628   LEUKOCYTESUR large (3+) 06/20/2013 1115    STUDIES: CLINICAL DATA: History of malignant lumpectomy of the left breast with radiation therapy in 2011. Annual re-evaluation.  EXAM: DIGITAL DIAGNOSTIC BILATERAL MAMMOGRAM WITH 3D TOMOSYNTHESIS AND CAD  COMPARISON: Previous examinations the most recent which is dated 08/15/2013.  ACR Breast Density Category b: There are scattered areas of fibroglandular density.  FINDINGS: There are stable scarring changes present within the left breast related to the patient's lumpectomy. There is stable calcifications consistent with fat necrosis present. There is no specific evidence for recurrent tumor or developing malignancy within either breast.  Mammographic images were processed with CAD.  IMPRESSION: No findings worrisome for recurrent tumor or developing malignancy.  RECOMMENDATION: Bilateral screening mammography in 1 year.  I have discussed the findings and recommendations with the patient. Results were also provided in writing at the conclusion of the visit. If applicable, a reminder letter will be sent to the patient regarding the next appointment.  BI-RADS CATEGORY 2: Benign.   Electronically Signed  By: Altamese Cabal M.D.  On: 08/22/2014 11:19   ASSESSMENT/PLAN: 75 y.o. [place] woman: : 1.  Status post left breast needle core biopsy at the 2:30 position and left axilla biopsy on 06/10/2009 which showed invasive ductal carcinoma and metastatic carcinoma in the axilla area, ER 100%, PR 4%, Ki-67 19%.  2.  Status post left breast  lumpectomy with breast excision x 4 and lymph node resection on 07/18/2009 for a stage IIB, pT2 pN1a, 2.2 cm invasive ductal carcinoma with extracellular mucin, multifocal high-grade DCIS with calcifications, focal necrosis and lymphovascular invasion, close margins, grade 2,  ER 100%, PR 4%, KI 6719%, HER-2/neu negative by CISH with a ratio of 1.40, 1/8 positive lymph nodes.  3.  Status post chemotherapy with TC x1 cycle on 08/29/2009 resulted in the patient having a brief hospitalization.  Status post chemotherapy with CMF x1 cycle on 10/17/2009 also resulted in the patient being hospitalized.  4.  Status post left breast needle core biopsy on 12/02/2009 for recurrence of DCIS with calcifications.  5.  Status post radiation therapy from 02/03/2010 through 03/25/2010.  6.  The patient started antiestrogen therapy with Arimidex from 03/2010 through 12/2011.  Her antiestrogen therapy was then changed to Femara from 12/2011 to the present.  7.  Abnormal bilateral digital diagnostic mammogram on 08/10/2011 which showed evidence of a probable fat necrosis in the anterior aspect of the left lumpectomy site. The patient had a follow up left digital diagnostic mammogram on 02/05/2012 which showed left breast calcifications appeared benign and compatible with fat necrosis, no other suspicious findings within the left breast.    8. Bilateral mammogram which was done in May 2014 revealed no evidence of any malignancy. BI-RADS category 2. She is scheduled to have repeat mammogram 08/22/2014  8.  Osteopenia: DEXA scan 08/15/2013 showed a T score of -2.1  PLAN: She has completed 5 years on anti-estrogens. She does have about 3 month supply left and shemight as well continue to take those until they run out However I don't think she would get much benefit from taking an additional 5 yearsof letrozole. She has multiple other comorbidities which I think at this point may be more important than her history of breast  cancer. In studies when patients take an additional 5 years of anastrozole the risk of outside the breast recurrence decreases by only 1%.  We did discuss our new survivorship program and I think she would be an excellent candidate for it. She is interested. I have gone ahead and placed an appointment for her in July, after her June mammogram.  As far as breast cancer screening is concerned all she will need is yearly mammography and a yearly physician breast exam.  I will be glad to see her again at any point in the future if and when the need arises, but as of now we are not making her any further routine appointments with me here.  Chauncey Cruel, MD    04/16/2015, 11:39 AM

## 2015-08-21 ENCOUNTER — Other Ambulatory Visit: Payer: Self-pay | Admitting: Family Medicine

## 2015-08-21 DIAGNOSIS — Z1231 Encounter for screening mammogram for malignant neoplasm of breast: Secondary | ICD-10-CM

## 2015-09-06 ENCOUNTER — Ambulatory Visit
Admission: RE | Admit: 2015-09-06 | Discharge: 2015-09-06 | Disposition: A | Discharge status: Home / Self Care | Payer: Medicare Other | Source: Ambulatory Visit | Attending: Family Medicine | Admitting: Family Medicine

## 2015-09-06 DIAGNOSIS — Z1231 Encounter for screening mammogram for malignant neoplasm of breast: Secondary | ICD-10-CM

## 2015-10-08 ENCOUNTER — Encounter: Payer: Medicare Other | Admitting: Nurse Practitioner

## 2015-10-09 NOTE — Progress Notes (Signed)
CLINIC:  Survivorship   REASON FOR VISIT:  Routine follow-up for history of breast cancer.   BRIEF ONCOLOGIC HISTORY:    Cancer of overlapping sites of left female breast ([place])   06/10/2009 Initial Biopsy (L) breast needle core biopsy (2:30): IDC with abundant extracellular mucin, grade 2. (L) axillary LN: (+) metastatic carcinoma.    06/10/2009 Breast US Irregular, hypoechoic mass in (L) breast at 2:30 position, 7 cm from nipple.  Mass measures 1.9 x 1.8 x 1.4 cm. There are 2 abnormal appearing LNs, largest measuring 1.3 cm, with loss of normal fatty hilum.    06/10/2009 Initial Diagnosis Cancer of overlapping sites of left female breast ([place])   06/10/2009 Mammogram  2.2 cm spiculated mass in (L) breast at 2:30 with worrisome pleomorphic microcalcs extending inferiorly & posteriorly for a 7 cm distance. Several abnormal appearing lower (L) axillary LNs worrisome for possible mets.    06/25/2009 PET scan Evidence of primary (L) breast cancer & (L) axillary nodal mets. No evidence of central nodal mets, pulmonary mets, or distant soft tissue mets. Focal fairly intense uptake within T4 vertebral body, concerning for (+) mets. Recommend MRI to confirm.    06/25/2009 Imaging CT c/a/p: Evidence of (L) axillary nodal mets. No evidence of central nodal, mediastinal, or pulmonary mets.  No evidence of abdominal or pelvic mets. No CT evidence of skeletal mets. There is concern for thoracic mets on comparison PET CT.    07/07/2009 Imaging MRI thoracic spine: Findings compatible with bone mets at T4. No other evidence of metastatic disease identitied.    07/17/2009 Surgery (L) lumpectomy with ALND:    07/17/2009 Pathology Results IDC with extracellular mucin, grade 2, multifocal, largest lesion 2.2 cm; DCIS, high-grade with calcs & focal necrosis. LVI (+).  1/8 axillary LNs (+) for metastatic carcinoma. ER+ (100%), PR+ (4%), HER2 neg (ratio 1.40), Ki67 4%; Margins close    07/17/2009 Pathologic Stage pT2, pN1a: Stage  IIB   07/30/2009 Procedure T4 bone biopsy: No metastatic malignancy or increased plasma cells.    08/29/2009 -  Chemotherapy TC x 1 cycle which resulted in patient being briefly hospitalized.    10/17/2009 -  Chemotherapy CMF x 1 cycle with resulted in patient being hospitalized.    12/02/2009 Procedure (L) breast needle core biopsy: DCIS with calcs. ER+ (100%), PR + (89%)   12/23/2009 Surgery Re-excision: IDC with extracellular mucin, grade 1, microscopic focus. LVI (+). DCIS, grade 2, spanning at least 1.1 cm; DCIS is focally present at surgical resection margin; also focus of [place].   02/03/2010 - 03/25/2010 Radiation Therapy Adjuvant radiation Sarah Burgess). Left breast: 50.4 Gy in 28 fractions. Left breast boost: 10 Gy in 5 fractions.    03/2010 - 12/2011 Anti-estrogen oral therapy Arimidex started.    08/10/2011 Mammogram Evidence for probable fat necrosis in anterior aspect of (L) lumpectomy site (UOQ). 96-monthf/u recommended. No mammographic evidence of malignancy in (R) breast.    12/2011 - 2017 Anti-estrogen oral therapy Therapy changed to Femara. (5 years of therapy completed). Dr. MJana Hakimdid not recommend continued therapy beyond 5 years.    02/05/2012 Mammogram (L) breast calcs now appear benign and compatible with fat necrosis. No suspicious findings in (L) breast. (L) breast scarring.    08/10/2012 Mammogram Stable benign postop change. No malignancy.    08/15/2013 Mammogram No mammographic evidence of local recurrence, left breast. No mammographic evidence of malignancy, right breast.   08/15/2013 Imaging DEXA scan: Osteopenia. T-score -2.1   08/22/2014 Mammogram No  findings worrisome for recurrent tumor or developing malignancy.  Recommend bilat screening mammo in 1 year.    09/06/2015 Mammogram Screening bilat tomo mammo: No mammographic evidence of malignancy.     INTERVAL HISTORY:  Sarah Burgess presents to the Survivorship Clinic today for routine follow-up for her history of breast cancer.   Overall, she reports feeling pretty well.   She has intermittent bilateral breast pains.  Otherwise, she has no complaints related to her breasts.  She easily becomes short of breath when walking; she has to stop and rest.  She has irregular heartbeat, palpitations, and some feet swelling.  She has a productive cough at times; no fever/chills. She has some leg pains that are like muscle aches and cramping sometimes. Her appetite is good, about 75%.  She has intermittent hot flashes.   REVIEW OF SYSTEMS:  Review of Systems  Constitutional: Positive for malaise/fatigue. Negative for fever and chills.       Some fatigue  Eyes: Negative for blurred vision.  Respiratory: Positive for cough and shortness of breath.   Cardiovascular: Positive for chest pain, palpitations and leg swelling.  Gastrointestinal: Negative for nausea, vomiting, diarrhea and constipation.  Genitourinary: Negative for dysuria.  Musculoskeletal: Positive for back pain and joint pain.  Skin: Negative for rash.  Neurological: Negative for dizziness and headaches.  Endo/Heme/Allergies: Does not bruise/bleed easily.  GU: Denies vaginal bleeding, discharge, or dryness.  Breast: Denies any new nodularity, masses, tenderness, nipple changes, or nipple discharge.    A 14-point review of systems was completed and was negative, except as noted above.    PAST MEDICAL/SURGICAL HISTORY:  Past Medical History  Diagnosis Date  . Diverticulitis   . Pneumonia   . Thyroid disease   . Hemorrhoids   . Arthritis   . Hernia   . Asthma   . GERD (gastroesophageal reflux disease)   . Allergy   . Cancer     left breast and thyroid    Past Surgical History  Procedure Laterality Date  . Cholecystectomy    . Abdominal hysterectomy    . Breast lumpectomy  2011    left breast  . Port-a-cath removal  02/23/2011    Procedure: REMOVAL PORT-A-CATH;  Surgeon: Haywood Lasso, MD;  Location: [facility];  Service: General;   Laterality: N/A;  remove access port right chest     ALLERGIES:  Allergies  Allergen Reactions  . Sulfa Drugs Cross Reactors   . Tape     Breaks her out     CURRENT MEDICATIONS:  Outpatient Encounter Prescriptions as of 10/10/2015  Medication Sig Note  . atorvastatin (LIPITOR) 10 MG tablet TAKE ONE TABLET BY MOUTH ONE TIME DAILY    . Calcium Carbonate (CALTRATE 600 PO) Take by mouth 2 (two) times daily.     . hydrochlorothiazide (MICROZIDE) 12.5 MG capsule TAKE ONE CAPSULE BY MOUTH ONE TIME DAILY    . lisinopril (PRINIVIL,ZESTRIL) 40 MG tablet TAKE ONE TABLET BY MOUTH ONE TIME DAILY    . metoprolol (TOPROL-XL) 200 MG 24 hr tablet TAKE ONE TABLET BY MOUTH ONE TIME DAILY    . ranitidine (ZANTAC) 75 MG tablet Take 75 mg by mouth 2 (two) times daily as needed.  09/12/2012: prn   No facility-administered encounter medications on file as of 10/10/2015.     ONCOLOGIC FAMILY HISTORY:  Family History  Problem Relation Age of Onset  . Stroke Mother   . Heart disease Mother   . Early death Mother   .  Diabetes Mother   . Cancer Father     pt unaware of what kind  . Heart disease Father   . Diabetes Father   . Heart disease Sister   . Diabetes Sister   . Arthritis Son      SOCIAL HISTORY:  Sarah Burgess is a widow for 15+ years.  She has 2 sons. One of her sons recently had to have his leg amputated for a medical condition.  She has 3 grandsons. 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 For fun, she really enjoys reading.      PHYSICAL EXAMINATION:  Vital Signs: Filed Vitals:   10/10/15 1406 10/10/15 1451  BP: 119/82   Pulse: 120 90  Temp: 98.4 F (36.9 C)   Resp: 20    Filed Weights   10/10/15 1406  Weight: 239 lb 8 oz (108.636 kg)   General: Well-nourished, well-appearing female in no acute distress.  She is unaccompanied today.   HEENT: Head is normocephalic.  Pupils equal and reactive to light and accomodation. Conjunctivae clear without  exudate.  Sclerae anicteric. Oral mucosa is pink, moist.  Oropharynx is pink without lesions or erythema.  Lymph: No cervical, supraclavicular, or infraclavicular lymphadenopathy noted on palpation.  Cardiovascular: Tachycardic, irregular rhythm; periods of regular rhythm with increased heart rate.  Respiratory: Mild rhonchi in upper lobes. Otherwise, clear to auscultation bilaterally. Chest expansion symmetric; breathing non-labored. (noted dyspnea on exertion however).   Breast Exam:  -Right breast: No appreciable masses on palpation. Skin on breast is intact and without erythema. -Left breast: No appreciable masses on palpation. Skin on breast is intact and without erythema; healed scar without erythema or nodularity.  Expected post-treatment changes palpated. Mild tenderness to palpation in UOQ and lateral breast (in previously treated areas).  -Axilla: No axillary adenopathy bilaterally.  GI: Abdomen soft and round; non-tender, non-distended. Bowel sounds normoactive. No hepatosplenomegaly.   GU: Deferred.  Neuro: No focal deficits. Steady gait.  Psych: Mood and affect normal and appropriate for situation.  Extremities: No edema. Skin: Warm and dry.  LABORATORY DATA:  None for this visit.   DIAGNOSTIC IMAGING:  Most recent mammogram: 09/06/15 FINDINGS: There are no findings suspicious for malignancy. Images were processed with CAD.  IMPRESSION: No mammographic evidence of malignancy.    ASSESSMENT AND PLAN:  Sarah Burgess is a pleasant 75 y.o. 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 She began anti-estrogen therapy with Arimidex in 03/2010 through  12/2011, when she was switched to Femara and completed 5 years of anti-estrogen therapy in 2017. Dr. Jana Hakim did not recommend additional AI therapy beyond 5 years. She presents to the Survivorship Clinic for surveillance and routine follow-up.   1. History of multi-focal Stage IIB left breast cancer:  Sarah Burgess is currently clinically without evidence of diease or recurrence of breast cancer. She will have her annual mammogram in 08/2016 and then follow-up with me in survivorship in 09/2016 for her annual exam.  She understands that I would be happy to see her sooner if new concerns arise before her next visit here.   2. Tachycardia: Sarah Burgess was tachycardic with HR in 120s initially during our visit today.  At later recheck before leaving the clinic, her HR had decreased to 90. On exam, her heart rate was irregular with periods of regularity, suggesting she may be having paroxysmal A-fib or A-flutter.  She is currently taking Toprol XL and may need medication adjustment for her possible arrhythmia.  This is likely unrelated to her cancer history. I suspect many of her current complaints, including dyspnea on exertion, palpitations, feet swelling, etc may be related to a cardiac etiology.  I encouraged her to follow-up with her cardiologist as soon as she is able.  She tells me that she is in the process of moving from her current apartment and will plan to see Dr. Wynonia Lawman, her preferred cardiologist, sometime in early Fall.      3. Bone health:  Given Sarah Burgess's history of breast cancer and her chemoprevention with aromatase inhibitors, she is at risk for bone demineralization.  Her last DEXA scan was on 08/15/13 and showed osteopenia.  She is eligible for 2-year evaluation at this time. Since she has completed her aromatase inhibitor therapy, I will defer to her PCP to order any subsequent bone mineral density testing.      Dispo:  -Mammogram due in 08/2016; orders placed today -Return to cancer  center to see Survivorship NP in 09/2016 for annual visit.    A total of 30 minutes of face-to-face time was spent with this patient with greater than 50% of that time in counseling and care-coordination.   Mike Craze, NP Survivorship Program [place] [phone]   Note: PRIMARY CARE PROVIDER Phineas Inches, [place] [phone]

## 2015-10-10 ENCOUNTER — Other Ambulatory Visit: Payer: Self-pay | Admitting: Adult Health

## 2015-10-10 ENCOUNTER — Ambulatory Visit (HOSPITAL_BASED_OUTPATIENT_CLINIC_OR_DEPARTMENT_OTHER): Payer: Medicare Other | Admitting: Adult Health

## 2015-10-10 ENCOUNTER — Telehealth: Payer: Self-pay | Admitting: Adult Health

## 2015-10-10 VITALS — BP 119/82 | HR 90 | Temp 98.4°F | Resp 20 | Ht 63.0 in | Wt 239.5 lb

## 2015-10-10 DIAGNOSIS — R000 Tachycardia, unspecified: Secondary | ICD-10-CM | POA: Diagnosis not present

## 2015-10-10 DIAGNOSIS — Z853 Personal history of malignant neoplasm of breast: Secondary | ICD-10-CM | POA: Diagnosis not present

## 2015-10-10 DIAGNOSIS — M8580 Other specified disorders of bone density and structure, unspecified site: Secondary | ICD-10-CM | POA: Diagnosis not present

## 2015-10-10 DIAGNOSIS — C50812 Malignant neoplasm of overlapping sites of left female breast: Secondary | ICD-10-CM

## 2015-10-10 NOTE — Telephone Encounter (Signed)
appt made and avs printed mammogram will be sched by breast center once order is changed

## 2015-10-10 NOTE — Patient Instructions (Signed)
Thank you for coming in to see me today. I enjoyed meeting you!   Be sure to see your cardiologist soon to follow-up on your heart palpitations.   Call me with any questions related to your breast cancer. I'll see you next year!  Mike Craze, NP [place] [phone]

## 2016-01-03 ENCOUNTER — Inpatient Hospital Stay (HOSPITAL_COMMUNITY)
Admission: EM | Admit: 2016-01-03 | Discharge: 2016-01-05 | DRG: 194 | Disposition: A | Discharge status: Home / Self Care | Payer: Medicare Other | Attending: Family Medicine | Admitting: Family Medicine

## 2016-01-03 ENCOUNTER — Emergency Department (HOSPITAL_COMMUNITY): Payer: Medicare Other

## 2016-01-03 ENCOUNTER — Encounter (HOSPITAL_COMMUNITY): Payer: Self-pay | Admitting: Emergency Medicine

## 2016-01-03 ENCOUNTER — Other Ambulatory Visit (HOSPITAL_COMMUNITY): Payer: Medicare Other

## 2016-01-03 DIAGNOSIS — Z23 Encounter for immunization: Secondary | ICD-10-CM

## 2016-01-03 DIAGNOSIS — Z833 Family history of diabetes mellitus: Secondary | ICD-10-CM

## 2016-01-03 DIAGNOSIS — Z8249 Family history of ischemic heart disease and other diseases of the circulatory system: Secondary | ICD-10-CM | POA: Diagnosis not present

## 2016-01-03 DIAGNOSIS — R0902 Hypoxemia: Secondary | ICD-10-CM | POA: Diagnosis present

## 2016-01-03 DIAGNOSIS — J181 Lobar pneumonia, unspecified organism: Secondary | ICD-10-CM | POA: Diagnosis not present

## 2016-01-03 DIAGNOSIS — C7951 Secondary malignant neoplasm of bone: Secondary | ICD-10-CM | POA: Diagnosis present

## 2016-01-03 DIAGNOSIS — E669 Obesity, unspecified: Secondary | ICD-10-CM | POA: Diagnosis present

## 2016-01-03 DIAGNOSIS — Z9221 Personal history of antineoplastic chemotherapy: Secondary | ICD-10-CM | POA: Diagnosis not present

## 2016-01-03 DIAGNOSIS — Z823 Family history of stroke: Secondary | ICD-10-CM

## 2016-01-03 DIAGNOSIS — Z6841 Body Mass Index (BMI) 40.0 and over, adult: Secondary | ICD-10-CM | POA: Diagnosis not present

## 2016-01-03 DIAGNOSIS — Z853 Personal history of malignant neoplasm of breast: Secondary | ICD-10-CM | POA: Diagnosis not present

## 2016-01-03 DIAGNOSIS — R0602 Shortness of breath: Secondary | ICD-10-CM | POA: Diagnosis present

## 2016-01-03 DIAGNOSIS — K219 Gastro-esophageal reflux disease without esophagitis: Secondary | ICD-10-CM | POA: Diagnosis present

## 2016-01-03 DIAGNOSIS — J189 Pneumonia, unspecified organism: Principal | ICD-10-CM | POA: Diagnosis present

## 2016-01-03 DIAGNOSIS — J45909 Unspecified asthma, uncomplicated: Secondary | ICD-10-CM | POA: Diagnosis present

## 2016-01-03 DIAGNOSIS — Z170 Estrogen receptor positive status [ER+]: Secondary | ICD-10-CM | POA: Diagnosis not present

## 2016-01-03 DIAGNOSIS — E785 Hyperlipidemia, unspecified: Secondary | ICD-10-CM | POA: Diagnosis present

## 2016-01-03 DIAGNOSIS — Z87891 Personal history of nicotine dependence: Secondary | ICD-10-CM

## 2016-01-03 DIAGNOSIS — C50919 Malignant neoplasm of unspecified site of unspecified female breast: Secondary | ICD-10-CM

## 2016-01-03 DIAGNOSIS — I10 Essential (primary) hypertension: Secondary | ICD-10-CM | POA: Diagnosis present

## 2016-01-03 LAB — BLOOD GAS, ARTERIAL
Acid-Base Excess: 4 mmol/L — ABNORMAL HIGH (ref 0.0–2.0)
Bicarbonate: 27.5 mmol/L (ref 20.0–28.0)
Drawn by: 295031
FIO2: 21
O2 Saturation: 89.3 %
Patient temperature: 98.6
pCO2 arterial: 38.9 mmHg (ref 32.0–48.0)
pH, Arterial: 7.462 — ABNORMAL HIGH (ref 7.350–7.450)
pO2, Arterial: 54.3 mmHg — ABNORMAL LOW (ref 83.0–108.0)

## 2016-01-03 LAB — BASIC METABOLIC PANEL
Anion gap: 8 (ref 5–15)
BUN: 12 mg/dL (ref 6–20)
CO2: 28 mmol/L (ref 22–32)
Calcium: 9.5 mg/dL (ref 8.9–10.3)
Chloride: 102 mmol/L (ref 101–111)
Creatinine, Ser: 0.92 mg/dL (ref 0.44–1.00)
GFR calc Af Amer: >60 mL/min (ref >60)
GFR calc non Af Amer: 60 mL/min — ABNORMAL LOW (ref >60)
Glucose, Bld: 124 mg/dL — ABNORMAL HIGH (ref 65–99)
Potassium: 3.7 mmol/L (ref 3.5–5.1)
Sodium: 138 mmol/L (ref 135–145)

## 2016-01-03 LAB — CBC
HCT: 47.9 % — ABNORMAL HIGH (ref 36.0–46.0)
Hemoglobin: 16.3 g/dL — ABNORMAL HIGH (ref 12.0–15.0)
MCH: 32.8 pg (ref 26.0–34.0)
MCHC: 34 g/dL (ref 30.0–36.0)
MCV: 96.4 fL (ref 78.0–100.0)
Platelets: 287 10*3/uL (ref 150–400)
RBC: 4.97 MIL/uL (ref 3.87–5.11)
RDW: 12.8 % (ref 11.5–15.5)
WBC: 17.8 10*3/uL — ABNORMAL HIGH (ref 4.0–10.5)

## 2016-01-03 LAB — TROPONIN I: Troponin I: <0.03 ng/mL (ref <0.03)

## 2016-01-03 LAB — I-STAT TROPONIN, ED: Troponin i, poc: 0 ng/mL (ref 0.00–0.08)

## 2016-01-03 LAB — I-STAT CG4 LACTIC ACID, ED: Lactic Acid, Venous: 1.48 mmol/L (ref 0.5–1.9)

## 2016-01-03 LAB — D-DIMER, QUANTITATIVE: D-Dimer, Quant: 0.93 ug/mL-FEU — ABNORMAL HIGH (ref 0.00–0.50)

## 2016-01-03 MED ORDER — FAMOTIDINE 20 MG PO TABS
10.0000 mg | ORAL_TABLET | Freq: Every day | ORAL | Status: DC
  Administered 2016-01-04 – 2016-01-05 (×2): 10 mg via ORAL
  Filled 2016-01-03 (×2): qty 1

## 2016-01-03 MED ORDER — DEXTROSE 5 % IV SOLN: 1.0000 g | Freq: Once | INTRAVENOUS | Status: DC

## 2016-01-03 MED ORDER — METOPROLOL SUCCINATE ER 100 MG PO TB24
200.0000 mg | ORAL_TABLET | Freq: Every day | ORAL | Status: DC
  Administered 2016-01-04 – 2016-01-05 (×2): 200 mg via ORAL
  Filled 2016-01-03 (×2): qty 2

## 2016-01-03 MED ORDER — LISINOPRIL 20 MG PO TABS
40.0000 mg | ORAL_TABLET | Freq: Every day | ORAL | Status: DC
Start: 2016-01-03 — End: 2016-01-05
  Administered 2016-01-03 – 2016-01-05 (×3): 40 mg via ORAL
  Filled 2016-01-03 (×3): qty 2

## 2016-01-03 MED ORDER — HYDROCHLOROTHIAZIDE 12.5 MG PO CAPS
12.5000 mg | ORAL_CAPSULE | Freq: Every day | ORAL | Status: DC
Start: 2016-01-04 — End: 2016-01-05
  Administered 2016-01-04 – 2016-01-05 (×2): 12.5 mg via ORAL
  Filled 2016-01-03 (×2): qty 1

## 2016-01-03 MED ORDER — MORPHINE SULFATE (PF) 4 MG/ML IV SOLN
4.0000 mg | INTRAVENOUS | Status: DC | PRN
  Administered 2016-01-04 – 2016-01-05 (×4): 4 mg via INTRAVENOUS
  Filled 2016-01-03 (×4): qty 1

## 2016-01-03 MED ORDER — AZITHROMYCIN 250 MG PO TABS
500.0000 mg | ORAL_TABLET | Freq: Every day | ORAL | Status: DC
  Administered 2016-01-03 – 2016-01-04 (×2): 500 mg via ORAL
  Filled 2016-01-03 (×2): qty 2

## 2016-01-03 MED ORDER — MORPHINE SULFATE (PF) 4 MG/ML IV SOLN
4.0000 mg | Freq: Once | INTRAVENOUS | Status: AC
  Administered 2016-01-03: 4 mg via INTRAVENOUS
  Filled 2016-01-03: qty 1

## 2016-01-03 MED ORDER — IOPAMIDOL (ISOVUE-370) INJECTION 76%: 100.0000 mL | Freq: Once | INTRAVENOUS | Status: DC | PRN

## 2016-01-03 MED ORDER — ONDANSETRON HCL 4 MG/2ML IJ SOLN
4.0000 mg | Freq: Once | INTRAMUSCULAR | Status: AC
  Administered 2016-01-03: 4 mg via INTRAVENOUS
  Filled 2016-01-03: qty 2

## 2016-01-03 MED ORDER — ENOXAPARIN SODIUM 40 MG/0.4ML ~~LOC~~ SOLN
40.0000 mg | Freq: Every day | SUBCUTANEOUS | Status: DC
  Administered 2016-01-03 – 2016-01-04 (×2): 40 mg via SUBCUTANEOUS
  Filled 2016-01-03 (×2): qty 0.4

## 2016-01-03 MED ORDER — KETOROLAC TROMETHAMINE 30 MG/ML IJ SOLN: 30.0000 mg | Freq: Once | INTRAMUSCULAR | Status: DC

## 2016-01-03 MED ORDER — IOPAMIDOL (ISOVUE-370) INJECTION 76%
100.0000 mL | Freq: Once | INTRAVENOUS | Status: AC | PRN
  Administered 2016-01-03: 100 mL via INTRAVENOUS

## 2016-01-03 MED ORDER — DEXTROSE 5 % IV SOLN
1.0000 g | Freq: Every day | INTRAVENOUS | Status: DC
  Administered 2016-01-03 – 2016-01-04 (×2): 1 g via INTRAVENOUS
  Filled 2016-01-03 (×2): qty 10

## 2016-01-03 MED ORDER — DEXTROSE 5 % IV SOLN: 500.0000 mg | Freq: Once | INTRAVENOUS | Status: DC

## 2016-01-03 NOTE — H&P (Signed)
. [facility] History and Physical  Sarah Burgess ZLD:[id number] DOB: 09/03/1940 DOA: 01/03/2016  Referring physician: Gilford Raid PCP: Phineas Inches, MD  Specialists: none   Chief Complaint: SOB  HPI:  75 y/o ? H/o BR cancer T2N er/pr + s/p lumpectomy/xrt /poor tol to chemo, known h/o Thor mets in the past to T4 Prior PMP bleeding htn hld Prior diverticulosis  2 day history of shortness of with chest pain in the left side which is intermittent with some radiation around the back No sick contacts No contacts No cough Neuro: No fever No chills No blurred vision, no double vision No rash but does have some radiating back  Wbc 17,Hb 16 Bun/creat-12/0.92 ABG showed pH of 7.4 PaO2 of 54 and a CO2 of 38  CT angiogram of the chest was performed that did not reveal any common embolism elevated d-dimer but did show that patient had a pneumonia and left side Sclerosis of T5 vertebral body was also noted which might be secondary to metastatic disease  Tells her that she used to smoke until 58 has and died she is originally from [place] and has 2 adult children and grandchildren She currently resides alone in the throat has fully(s)  No known drug allergies father had her as  Review of Systems: The patient denies anorexia, fever, weight loss,, vision loss, decreased hearing, hoarseness, chest pain, syncope, dyspnea on exertion, peripheral edema, balance deficits, hemoptysis, abdominal pain, melena, hematochezia, severe indigestion/heartburn, hematuria, incontinence, genital sores, muscle weakness, suspicious skin lesions, transient blindness, difficulty walking, depression, unusual weight change, abnormal bleeding, enlarged lymph nodes, angioedema, and breast masses.   Past Medical History:  Diagnosis Date  . Allergy   . Arthritis   . Asthma   . Cancer [facility])    left breast and thyroid   . Diverticulitis   . GERD (gastroesophageal reflux disease)   . Hemorrhoids   .  Hernia   . Pneumonia   . Thyroid disease    Past Surgical History:  Procedure Laterality Date  . ABDOMINAL HYSTERECTOMY    . BREAST LUMPECTOMY  2011   left breast  . CHOLECYSTECTOMY    . PORT-A-CATH REMOVAL  02/23/2011   Procedure: REMOVAL PORT-A-CATH;  Surgeon: Haywood Lasso, MD;  Location: [facility];  Service: General;  Laterality: N/A;  remove access port right chest   Social History:  Social History   Social History Narrative  . No narrative on file    Allergies  Allergen Reactions  . Sulfa Drugs Cross Reactors Nausea Only    Made patient very ill   . Tape Hives and Itching    Breaks her out    Family History  Problem Relation Age of Onset  . Stroke Mother   . Heart disease Mother   . Early death Mother   . Diabetes Mother   . Cancer Father     pt unaware of what kind  . Heart disease Father   . Diabetes Father   . Heart disease Sister   . Diabetes Sister   . Arthritis Son     Prior to Admission medications   Medication Sig Start Date End Date Taking? Authorizing Provider  atorvastatin (LIPITOR) 20 MG tablet Take 20 mg by mouth daily.   Yes Historical Provider, MD  Calcium Carbonate-Vitamin D (CALTRATE 600+D PO) Take 1 tablet by mouth daily.   Yes Historical Provider, MD  hydrochlorothiazide (MICROZIDE) 12.5 MG capsule TAKE ONE CAPSULE BY MOUTH ONE TIME DAILY  12/26/13  Yes Midge Minium, MD  lisinopril (PRINIVIL,ZESTRIL) 40 MG tablet TAKE ONE TABLET BY MOUTH ONE TIME DAILY  12/26/13  Yes Midge Minium, MD  metoprolol (TOPROL-XL) 200 MG 24 hr tablet TAKE ONE TABLET BY MOUTH ONE TIME DAILY  12/26/13  Yes Midge Minium, MD  ranitidine (ZANTAC) 75 MG tablet Take 75 mg by mouth daily as needed for heartburn.    Yes Historical Provider, MD  atorvastatin (LIPITOR) 10 MG tablet TAKE ONE TABLET BY MOUTH ONE TIME DAILY  Patient not taking: Reported on 01/03/2016 03/26/14   Midge Minium, MD   Physical Exam: Vitals:   01/03/16 1458  01/03/16 1551  BP: 159/82 155/75  Pulse: 78 79  Resp: 18 20  Temp: 99.1 F (37.3 C)   TempSrc: Oral   SpO2: 93% (!) 86%  Weight:  104.3 kg (230 lb)  Height:  _0  (1.6 m)    Alert thick neck oriented no JVD EOMI NCAT No pallor no icterus Mallampati 3 S1-S2 no murmur rub or gallop no arrhythmia Abdomen is obese nontender nondistended Trace lower extremity edema Chest sounds clinically clear Neurologically is intact with no overt other issue    Labs on Admission:  Basic Metabolic Panel:  Recent Labs Lab 01/03/16 1601  NA 138  K 3.7  CL 102  CO2 28  GLUCOSE 124*  BUN 12  CREATININE 0.92  CALCIUM 9.5   Liver Function Tests: No results for input(s): AST, ALT, ALKPHOS, BILITOT, PROT, ALBUMIN in the last 168 hours. No results for input(s): LIPASE, AMYLASE in the last 168 hours. No results for input(s): AMMONIA in the last 168 hours. CBC:  Recent Labs Lab 01/03/16 1509  WBC 17.8*  HGB 16.3*  HCT 47.9*  MCV 96.4  PLT 287   Cardiac Enzymes: No results for input(s): CKTOTAL, CKMB, CKMBINDEX, TROPONINI in the last 168 hours.  BNP (last 3 results) No results for input(s): BNP in the last 8760 hours.  ProBNP (last 3 results) No results for input(s): PROBNP in the last 8760 hours.  CBG: No results for input(s): GLUCAP in the last 168 hours.  Radiological Exams on Admission: Dg Chest 2 View  Result Date: 01/03/2016 CLINICAL DATA:  Chest pain and shortness of breath today EXAM: CHEST  2 VIEW COMPARISON:  12/23/2009 FINDINGS: Chronic cardiopericardial enlargement. Atelectasis or scarring at the bases with linear appearance. There is hyperinflation with diaphragm flattening. There is no edema, consolidation, effusion, or pneumothorax. Smooth rounded opacity the right base Bochdalek's hernia based on 2011 CT. IMPRESSION: 1. No acute finding when compared to prior. 2. Chronic cardiomegaly, basilar scarring, and hyperinflation. 3. Rounded opacity at the right base is  a Bochdalek's hernia. Electronically Signed   By: Monte Fantasia M.D.   On: 01/03/2016 15:21   Ct Angio Chest Pe W And/or Wo Contrast  Result Date: 01/03/2016 CLINICAL DATA:  Left chest pain.  History of pneumonia and asthma. EXAM: CT ANGIOGRAPHY CHEST WITH CONTRAST TECHNIQUE: Multidetector CT imaging of the chest was performed using the standard protocol during bolus administration of intravenous contrast. Multiplanar CT image reconstructions and MIPs were obtained to evaluate the vascular anatomy. CONTRAST:  100 mL of Isovue 370 COMPARISON:  None. FINDINGS: Cardiovascular: Coronary artery calcifications are seen. There is a Bochdalek's hernia on the right. Cardiomegaly is stable. Atherosclerotic change seen in the thoracic aorta. No thoracic aortic dissection or aneurysm. There is mild thickening of the esophagus which may be due to poor distention versus esophagitis. The esophagus is  otherwise normal. No pulmonary emboli. Mediastinum/Nodes: The thyroid is normal in appearance. No adenopathy. Postsurgical changes in the left breast Lungs/Pleura: Central airways are normal. No pneumothorax. Bibasilar opacities have increased in the interval. The opacities are largely platelike on the right suggesting primarily atelectasis. The opacity is more focal on the left with some associated right pleural fluid. No suspicious pulmonary nodules or masses. Upper Abdomen: No acute abnormality. Musculoskeletal: Sclerosis in the T5 vertebral body is worrisome for metastatic disease. Sclerosis at the inferior endplate of T8 and superior endplate of T9 is likely degenerative. Review of the MIP images confirms the above findings. IMPRESSION: 1. No pulmonary emboli. 2. Bibasilar opacities, favored to be atelectasis on the right and worrisome for infection/infiltrate on the left. Recommend short-term follow-up to ensure resolution. 3. Sclerosis in the T5 vertebral body worrisome for metastatic disease given the history of breast  cancer. Electronically Signed   By: Dorise Bullion III M.D   On: 01/03/2016 17:16    EKG: Independently reviewed.  PR interval 0.12 QRS axis 0.20 no good baseline however no inversions  Assessment/Plan  Potential pneumonia Cover with community-acquired coverage with ceftriaxone and azithromycin CBC plus differential in a.m. Follow sputum culture, legionell strep pneumo a etc. Etc.  ? T5 Met In 2011 had a T4 met in the spine and subsequently had a lumpectomy I had a brief conversation with her about workup same and she may require a PET scan ordered MRI of this area She may benefit from radiotherapy to this area and if MRI confirms the finding then I will recommend that the patient be referred to oncologist and radiation oncology for further management same as she already has completed chemotherapy and is on the survivorship program for breast cancer as above  Hypertension  continue HCTZ, lisinopril 40, metoprolol 200 XL daily  Hyperlipidemia hold for now her statin Reflux, continue Pepcid 10 daily     Time spent: [place], [place] [facility] Pager [phone]  If 7PM-7AM, please contact night-coverage [email or web address] Password [email or web address] 01/03/2016, 5:40 PM

## 2016-01-03 NOTE — ED Notes (Signed)
Bed: [id number] Expected date:  Expected time:  Means of arrival:  Comments: Hold or triage 1

## 2016-01-03 NOTE — ED Notes (Signed)
In MRI

## 2016-01-03 NOTE — ED Triage Notes (Signed)
Per pt, states left chest pain that started around 1030-pain radiates to back-states it feels like gas

## 2016-01-03 NOTE — Progress Notes (Signed)
PHARMACY NOTE -  ANTIBIOTIC RENAL DOSE ADJUSTMENT   Request received for Pharmacy to assist with antibiotic renal dose adjustment.  Patient has been initiated on ceftriaxone/azithromycin for CAP. Current dosage is appropriate and these antibiotics do not require renal dose adjustment.  Will sign off at this time.   Thanks, Doreene Eland, PharmD, BCPS.   Pager: [phone] 01/03/2016 9:41 PM

## 2016-01-03 NOTE — ED Provider Notes (Signed)
[facility] DEPT Provider Note   CSN: [id number] Arrival date & time: 01/03/16  1429     History   Chief Complaint Chief Complaint  Patient presents with  . Chest Pain    HPI SOLANCH RICHOUX is a 75 y.o. female.  Pt presents to the ED today with left sided chest pain.  She noticed it this morning.  She took a zantac because she thought it was gas.  The pt went to her pcp's office who did an EKG and a cbc and sent her here.  The pt does feel sob as well.  She has had a cough with productive sputum.      Past Medical History:  Diagnosis Date  . Allergy   . Arthritis   . Asthma   . Cancer [facility])    left breast and thyroid   . Diverticulitis   . GERD (gastroesophageal reflux disease)   . Hemorrhoids   . Hernia   . Pneumonia   . Thyroid disease     Patient Active Problem List   Diagnosis Date Noted  . CAP (community acquired pneumonia) 01/03/2016  . Vaginal discharge 06/20/2013  . Abdominal pain, suprapubic 04/11/2013  . Left knee pain 01/26/2013  . Routine general medical examination at a health care facility 12/20/2012  . Allergic rhinitis 12/20/2012  . Rash and nonspecific skin eruption 09/12/2012  . UTI symptoms 01/12/2012  . HTN (hypertension) 08/25/2011  . Hyperlipidemia 08/25/2011  . Skin lesion 08/25/2011  . Mass of axilla 11/07/2010  . 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 ABDOMINAL HYSTERECTOMY    . BREAST LUMPECTOMY  2011   left breast  . CHOLECYSTECTOMY    . PORT-A-CATH REMOVAL  02/23/2011   Procedure: REMOVAL PORT-A-CATH;  Surgeon: Haywood Lasso, MD;  Location: [facility];  Service: General;  Laterality: N/A;  remove access port right chest    OB History    No data available       Home Medications    Prior to Admission medications   Medication Sig Start Date End Date Taking? Authorizing Provider  atorvastatin  (LIPITOR) 20 MG tablet Take 20 mg by mouth daily.   Yes Historical Provider, MD  Calcium Carbonate-Vitamin D (CALTRATE 600+D PO) Take 1 tablet by mouth daily.   Yes Historical Provider, MD  hydrochlorothiazide (MICROZIDE) 12.5 MG capsule TAKE ONE CAPSULE BY MOUTH ONE TIME DAILY  12/26/13  Yes Midge Minium, MD  lisinopril (PRINIVIL,ZESTRIL) 40 MG tablet TAKE ONE TABLET BY MOUTH ONE TIME DAILY  12/26/13  Yes Midge Minium, MD  metoprolol (TOPROL-XL) 200 MG 24 hr tablet TAKE ONE TABLET BY MOUTH ONE TIME DAILY  12/26/13  Yes Midge Minium, MD  ranitidine (ZANTAC) 75 MG tablet Take 75 mg by mouth daily as needed for heartburn.    Yes Historical Provider, MD  atorvastatin (LIPITOR) 10 MG tablet TAKE ONE TABLET BY MOUTH ONE TIME DAILY  Patient not taking: Reported on 01/03/2016 03/26/14   Midge Minium, MD    Family History Family History  Problem Relation Age of Onset  . Stroke Mother   . Heart disease Mother   . Early death Mother   . Diabetes Mother   . Cancer Father     pt unaware of what kind  . Heart disease Father   . Diabetes Father   . Heart  disease Sister   . Diabetes Sister   . Arthritis Son     Social History Social History  Substance Use Topics  . Smoking status: Former Smoker    Quit date: 01/03/2000  . Smokeless tobacco: Never Used  . Alcohol use No     Allergies   Sulfa drugs cross reactors and Tape   Review of Systems Review of Systems  Respiratory: Positive for cough and shortness of breath.   Cardiovascular: Positive for chest pain.  All other systems reviewed and are negative.    Physical Exam Updated Vital Signs BP 155/75 (BP Location: Right Arm)   Pulse 79   Temp 99.1 F (37.3 C) (Oral)   Resp 20   Ht 5\' 3"  (1.6 m)   Wt 230 lb (104.3 kg)   SpO2 (!) 86%   BMI 40.74 kg/m   Physical Exam  Constitutional: She is oriented to person, place, and time. She appears well-developed and well-nourished.  HENT:  Head: Normocephalic and  atraumatic.  Right Ear: External ear normal.  Left Ear: External ear normal.  Nose: Nose normal.  Mouth/Throat: Oropharynx is clear and moist.  Eyes: Conjunctivae and EOM are normal. Pupils are equal, round, and reactive to light.  Neck: Normal range of motion. Neck supple.  Cardiovascular: Normal rate, regular rhythm, normal heart sounds and intact distal pulses.   Pulmonary/Chest: Effort normal. She has rhonchi.  Abdominal: Soft. Bowel sounds are normal.  Musculoskeletal: Normal range of motion.  Neurological: She is alert and oriented to person, place, and time.  Skin: Skin is warm.  Psychiatric: She has a normal mood and affect. Her behavior is normal. Judgment and thought content normal.  Nursing note and vitals reviewed.    ED Treatments / Results  Labs (all labs ordered are listed, but only abnormal results are displayed) Labs Reviewed  CBC - Abnormal; Notable for the following:       Result Value   WBC 17.8 (*)    Hemoglobin 16.3 (*)    HCT 47.9 (*)    All other components within normal limits  BLOOD GAS, ARTERIAL - Abnormal; Notable for the following:    pH, Arterial 7.462 (*)    pO2, Arterial 54.3 (*)    Acid-Base Excess 4.0 (*)    All other components within normal limits  BASIC METABOLIC PANEL - Abnormal; Notable for the following:    Glucose, Bld 124 (*)    GFR calc non Af Amer 60 (*)    All other components within normal limits  D-DIMER, QUANTITATIVE (NOT AT [facility]) - Abnormal; Notable for the following:    D-Dimer, Quant 0.93 (*)    All other components within normal limits  CULTURE, BLOOD (ROUTINE X 2)  CULTURE, BLOOD (ROUTINE X 2)  I-STAT TROPOININ, ED  I-STAT CG4 LACTIC ACID, ED  I-STAT CG4 LACTIC ACID, ED    EKG  EKG Interpretation  Date/Time:  Friday January 03 2016 14:39:30 EDT Ventricular Rate:  79 PR Interval:    QRS Duration: 84 QT Interval:  404 QTC Calculation: 464 R Axis:   111 Text Interpretation:  Sinus rhythm Left atrial enlargement  Right axis deviation Low voltage, precordial leads Abnormal R-wave progression, late transition Baseline wander in lead(s) V1 V3 V4 V6 Confirmed by Gilford Raid MD, Dorwin Fitzhenry ([id number]) on 01/03/2016 3:14:25 PM       Radiology Dg Chest 2 View  Result Date: 01/03/2016 CLINICAL DATA:  Chest pain and shortness of breath today EXAM: CHEST  2 VIEW COMPARISON:  12/23/2009 FINDINGS: Chronic cardiopericardial enlargement. Atelectasis or scarring at the bases with linear appearance. There is hyperinflation with diaphragm flattening. There is no edema, consolidation, effusion, or pneumothorax. Smooth rounded opacity the right base Bochdalek's hernia based on 2011 CT. IMPRESSION: 1. No acute finding when compared to prior. 2. Chronic cardiomegaly, basilar scarring, and hyperinflation. 3. Rounded opacity at the right base is a Bochdalek's hernia. Electronically Signed   By: Monte Fantasia M.D.   On: 01/03/2016 15:21   Ct Angio Chest Pe W And/or Wo Contrast  Result Date: 01/03/2016 CLINICAL DATA:  Left chest pain.  History of pneumonia and asthma. EXAM: CT ANGIOGRAPHY CHEST WITH CONTRAST TECHNIQUE: Multidetector CT imaging of the chest was performed using the standard protocol during bolus administration of intravenous contrast. Multiplanar CT image reconstructions and MIPs were obtained to evaluate the vascular anatomy. CONTRAST:  100 mL of Isovue 370 COMPARISON:  None. FINDINGS: Cardiovascular: Coronary artery calcifications are seen. There is a Bochdalek's hernia on the right. Cardiomegaly is stable. Atherosclerotic change seen in the thoracic aorta. No thoracic aortic dissection or aneurysm. There is mild thickening of the esophagus which may be due to poor distention versus esophagitis. The esophagus is otherwise normal. No pulmonary emboli. Mediastinum/Nodes: The thyroid is normal in appearance. No adenopathy. Postsurgical changes in the left breast Lungs/Pleura: Central airways are normal. No pneumothorax. Bibasilar  opacities have increased in the interval. The opacities are largely platelike on the right suggesting primarily atelectasis. The opacity is more focal on the left with some associated right pleural fluid. No suspicious pulmonary nodules or masses. Upper Abdomen: No acute abnormality. Musculoskeletal: Sclerosis in the T5 vertebral body is worrisome for metastatic disease. Sclerosis at the inferior endplate of T8 and superior endplate of T9 is likely degenerative. Review of the MIP images confirms the above findings. IMPRESSION: 1. No pulmonary emboli. 2. Bibasilar opacities, favored to be atelectasis on the right and worrisome for infection/infiltrate on the left. Recommend short-term follow-up to ensure resolution. 3. Sclerosis in the T5 vertebral body worrisome for metastatic disease given the history of breast cancer. Electronically Signed   By: Dorise Bullion III M.D   On: 01/03/2016 17:16    Procedures Procedures (including critical care time)  Medications Ordered in ED Medications  iopamidol (ISOVUE-370) 76 % injection 100 mL (not administered)  cefTRIAXone (ROCEPHIN) 1 g in dextrose 5 % 50 mL IVPB (not administered)  azithromycin (ZITHROMAX) 500 mg in dextrose 5 % 250 mL IVPB (not administered)  morphine 4 MG/ML injection 4 mg (not administered)  ketorolac (TORADOL) 30 MG/ML injection 30 mg (not administered)  morphine 4 MG/ML injection 4 mg (4 mg Intravenous Given 01/03/16 1541)  ondansetron (ZOFRAN) injection 4 mg (4 mg Intravenous Given 01/03/16 1541)  iopamidol (ISOVUE-370) 76 % injection 100 mL (100 mLs Intravenous Contrast Given 01/03/16 1648)     Initial Impression / Assessment and Plan / ED Course  I have reviewed the triage vital signs and the nursing notes.  Pertinent labs & imaging results that were available during my care of the patient were reviewed by me and considered in my medical decision making (see chart for details).  Clinical Course   Pain has improved with  morphine, but it is still there, so I ordered another dose.  The pt was placed on oxygen due to her hypoxia.  Pt d/w Dr. Verlon Au who will admit pt.   Final Clinical Impressions(s) / ED Diagnoses   Final diagnoses:  Community acquired pneumonia of left lower lobe  of lung [facility])  Hypoxia    New Prescriptions New Prescriptions   No medications on file     Isla Pence, MD 01/03/16 1753

## 2016-01-04 ENCOUNTER — Inpatient Hospital Stay (HOSPITAL_COMMUNITY): Payer: Medicare Other

## 2016-01-04 LAB — CBC WITH DIFFERENTIAL/PLATELET
Basophils Absolute: 0 10*3/uL (ref 0.0–0.1)
Basophils Relative: 0 %
Eosinophils Absolute: 0.1 10*3/uL (ref 0.0–0.7)
Eosinophils Relative: 2 %
HCT: 42.9 % (ref 36.0–46.0)
Hemoglobin: 14.1 g/dL (ref 12.0–15.0)
Lymphocytes Relative: 23 %
Lymphs Abs: 2.2 10*3/uL (ref 0.7–4.0)
MCH: 32.2 pg (ref 26.0–34.0)
MCHC: 32.9 g/dL (ref 30.0–36.0)
MCV: 97.9 fL (ref 78.0–100.0)
Monocytes Absolute: 1 10*3/uL (ref 0.1–1.0)
Monocytes Relative: 11 %
Neutro Abs: 6.2 10*3/uL (ref 1.7–7.7)
Neutrophils Relative %: 64 %
Platelets: 271 10*3/uL (ref 150–400)
RBC: 4.38 MIL/uL (ref 3.87–5.11)
RDW: 13.2 % (ref 11.5–15.5)
WBC: 9.6 10*3/uL (ref 4.0–10.5)

## 2016-01-04 LAB — TROPONIN I
Troponin I: <0.03 ng/mL (ref <0.03)
Troponin I: <0.03 ng/mL (ref <0.03)

## 2016-01-04 MED ORDER — LORAZEPAM 2 MG/ML IJ SOLN
1.0000 mg | Freq: Once | INTRAMUSCULAR | Status: AC
  Administered 2016-01-04: 2 mg via INTRAVENOUS
  Filled 2016-01-04: qty 1

## 2016-01-04 MED ORDER — PHENOL 1.4 % MT LIQD
1.0000 | OROMUCOSAL | Status: DC | PRN
Start: 2016-01-04 — End: 2016-01-05
  Administered 2016-01-04 (×2): 1 via OROMUCOSAL
  Filled 2016-01-04: qty 177

## 2016-01-04 MED ORDER — INFLUENZA VAC SPLIT QUAD 0.5 ML IM SUSY
0.5000 mL | PREFILLED_SYRINGE | INTRAMUSCULAR | Status: AC
  Administered 2016-01-05: 0.5 mL via INTRAMUSCULAR
  Filled 2016-01-04: qty 0.5

## 2016-01-04 NOTE — Progress Notes (Addendum)
The patient's upper dentures were returned to the patient by the secretary in the ED. Patient is very [employer] attorney.

## 2016-01-04 NOTE — Progress Notes (Signed)
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 Prior PMP bleeding htn hld Prior diverticulosis  2 day history of shortness of with chest pain in the left side which is intermittent with some radiation around the back No sick contacts No contacts  Wbc 17,Hb 16 Bun/creat-12/0.92 ABG showed pH of 7.4 PaO2 of 54 and a CO2 of 38  CT angiogram of the chest was performed that did not reveal any common embolism elevated d-dimer but did show that patient had a pneumonia and left side Sclerosis of T5 vertebral body was also noted which might be secondary to metastatic disease    Past medical history-As per Problem list Chart reviewed as below-   Consultants:    Procedures:    Antibiotics:  Azithromycin 10/13  Rocephin 10/13   Subjective   slightly sleepy 2/2 pain meds Pain is much improved but worsens when patient coughs No n/v   Objective    Interim History:   Telemetry:    Objective: Vitals:   01/03/16 1924 01/03/16 2128 01/03/16 2255 01/04/16 0447  BP: 143/64 (!) 162/72 (!) 136/58 139/68  Pulse: 93 78 81 77  Resp: '23 20  16  ' Temp: 98.4 F (36.9 C) 98.3 F (36.8 C)  98.9 F (37.2 C)  TempSrc: Oral Oral  Oral  SpO2: 92% 92% 93% 97%  Weight:  107.2 kg (236 lb 5.3 oz)    Height:        Intake/Output Summary (Last 24 hours) at 01/04/16 1027 Last data filed at 01/04/16 0900  Gross per 24 hour  Intake              770 ml  Output                0 ml  Net              770 ml    Exam:  General: eomi ncat Cardiovascular: s1 s2 no m/r/g Respiratory: clear no added sound Abdomen: soft nt nd no rebound no gaurd Skin no le edema Neuro intact moving 4 limbs equally, smile symm  Data Reviewed: Basic Metabolic Panel:  Recent Labs Lab 01/03/16 1601  NA 138  K 3.7  CL 102    CO2 28  GLUCOSE 124*  BUN 12  CREATININE 0.92  CALCIUM 9.5   Liver Function Tests: No results for input(s): AST, ALT, ALKPHOS, BILITOT, PROT, ALBUMIN in the last 168 hours. No results for input(s): LIPASE, AMYLASE in the last 168 hours. No results for input(s): AMMONIA in the last 168 hours. CBC:  Recent Labs Lab 01/03/16 1509  WBC 17.8*  HGB 16.3*  HCT 47.9*  MCV 96.4  PLT 287   Cardiac Enzymes:  Recent Labs Lab 01/03/16 1851 01/04/16 0024 01/04/16 0613  TROPONINI <0.03 <0.03 <0.03   BNP: Invalid input(s): POCBNP CBG: No results for input(s): GLUCAP in the last 168 hours.  No results found for this or any previous visit (from the past 240 hour(s)).   Studies:              All Imaging reviewed and is as per above notation   Scheduled Meds: . azithromycin  500 mg Oral QHS  . cefTRIAXone (ROCEPHIN)  IV  1 g Intravenous QHS  . enoxaparin (LOVENOX) injection  40 mg Subcutaneous QHS  .  famotidine  10 mg Oral Daily  . hydrochlorothiazide  12.5 mg Oral Daily  . [START ON 01/05/2016] Influenza vac split quadrivalent PF  0.5 mL Intramuscular Tomorrow-1000  . ketorolac  30 mg Intravenous Once  . lisinopril  40 mg Oral Daily  . LORazepam  1-2 mg Intravenous Once  . metoprolol  200 mg Oral Daily   Continuous Infusions:    Assessment/Plan:  Potential pneumonia Continue ceftriaxone and azithromycin Labs pending Follow sputum culture, legionell strep pneumo a etc. Etc.  ? T5 Met In 2011 had a T4 met in the spine and subsequently had a lumpectomy I had a brief conversation with her about workup same and she may require a PET scan MRI of this area pedning  Hypertension  continue HCTZ, lisinopril 40, metoprolol 200 XL daily  Hyperlipidemia hold for now her statin Reflux, continue Pepcid 10 daily   Await MRI If pain controlled and WBC down, transition to PO abx within 24 hours dispo likely home 24-48 hr  Verneita Griffes, MD  [facility] Pager  [phone] 01/04/2016, 10:27 AM    LOS: 1 day

## 2016-01-05 LAB — GLUCOSE, CAPILLARY: Glucose-Capillary: 98 mg/dL (ref 65–99)

## 2016-01-05 LAB — STREP PNEUMONIAE URINARY ANTIGEN: Strep Pneumo Urinary Antigen: NEGATIVE

## 2016-01-05 MED ORDER — LEVOFLOXACIN 500 MG PO TABS: 500.0000 mg | ORAL_TABLET | Freq: Every day | ORAL | 0 refills | Status: DC

## 2016-01-05 MED ORDER — LEVOFLOXACIN 500 MG PO TABS
500.0000 mg | ORAL_TABLET | Freq: Every day | ORAL | Status: DC
  Administered 2016-01-05: 500 mg via ORAL
  Filled 2016-01-05: qty 1

## 2016-01-05 MED ORDER — TRAMADOL HCL 50 MG PO TABS: 50.0000 mg | ORAL_TABLET | Freq: Four times a day (QID) | ORAL | 0 refills | Status: DC | PRN

## 2016-01-05 MED ORDER — ALUM & MAG HYDROXIDE-SIMETH 200-200-20 MG/5ML PO SUSP
30.0000 mL | ORAL | Status: DC | PRN
  Administered 2016-01-05: 30 mL via ORAL
  Filled 2016-01-05 (×2): qty 30

## 2016-01-05 NOTE — Progress Notes (Signed)
SATURATION QUALIFICATIONS: (This note is used to comply with regulatory documentation for home oxygen)  Patient Saturations on Room Air at Rest = 86%  Patient Saturations on Room Air while Ambulating = 84%  Patient Saturations on 3 Liters of oxygen while Ambulating = 90  Please briefly explain why patient needs home oxygen: Patient admitted for pneumonia and now requiring oxygen. Dr. Darylene Price MD stated patient will require home 02 at this time and it should be temporary.   Jenita Seashore, RN

## 2016-01-05 NOTE — Progress Notes (Signed)
Patient and son Lucina Mellow given discharge information. They both state understanding. Education provided about new medications and prescription for tramadol given to patient. Patient left facility with oxygen. Information provided about company who provided oxygen.  Jenita Seashore

## 2016-01-05 NOTE — Care Management Note (Signed)
Case Management Note  Patient Details  Name: Sarah Burgess MRN: [id number] Date of Birth: 06-13-1940  Subjective/Objective:     PNA                 Action/Plan: Discharge Planning: AVS reviewed: NCM spoke to pt and states she lives at home with her son, Lucina Mellow # [phone]. Explained [facility] will deliver portable tank to her prior to dc and deliver concentrator to her home.   PCP Coletta Memos, DAVID MD  Expected Discharge Date:  01/05/2016             Expected Discharge Plan:  Home/Self Care  In-House Referral:  NA  Discharge planning Services  CM Consult  Post Acute Care Choice:  NA Choice offered to:  NA  DME Arranged:  Oxygen DME Agency:  [place]:  NA [place] Agency:  NA  Status of Service:  Completed, signed off  If discussed at [place] of Stay Meetings, dates discussed:    Additional Comments:  Erenest Rasher, RN 01/05/2016, 1:57 PM

## 2016-01-05 NOTE — Discharge Summary (Signed)
Physician Discharge Summary  Sarah Burgess YSH:[id number] DOB: [birth year] DOA: 01/03/2016  PCP: Phineas Inches, MD  Admit date: 01/03/2016 Discharge date: 01/05/2016  Time spent: 35 minutes  Recommendations for Outpatient Follow-up:  1. Complete 4 more days levaquin Orally to complete course of treatment for pneumonia 2. Would recommend outpatient follow-up with oncology on an as needed basis only 3. Consider repeat chest x-ray in one month's note clearing of pneumonia will need home oxygen 3 L temporarily until pneumonia resolved  Discharge Diagnoses:  Active Problems:   CAP (community acquired pneumonia)   Pneumonia   Discharge Condition: imporved  Diet recommendation: hh low salt  Filed Weights   01/03/16 1551 01/03/16 2128  Weight: 104.3 kg (230 lb) 107.2 kg (236 lb 5.3 oz)    History of present illness:  75 y/o ?H/o BR cancer T2N er/pr + s/p lumpectomy/xrt /poor tol to chemo, known h/o Thor mets in the past to T4 Prior PMP bleeding htn hld Prior diverticulosis  2 day history of shortness of with chest pain in the left side which is intermittent with some radiation around the back No sick contacts No contacts  Wbc 17,Hb 16 Bun/creat-12/0.92 ABGshowed pH of 7.4 PaO2 of 54 and a CO2 of 38  CT angiogram of the chest was performed that did not reveal any common embolism elevated d-dimer but did show that patient had a pneumonia and left side Sclerosis of T5 vertebral body was also noted which might be secondary to metastatic disease  Hospital Course:  Potential pneumonia She was placed on  ceftriaxone and azithromycin on admission transition to by mouth Levaquin She experienced some improvement in her symptoms but did require oxygen probably because pain in the chest and pleuritic chest pain See below  ? T5 Met In 2011 had a T4 met in the spine and subsequently had a lumpectomy CT scan showed potential area of recurrence in T5 9 MRI was performed which  was suboptimal but did reveal no correlation of any metastases or metastatic process on T5 so no further workup intended at this time and patient can as an outpatient with oncology  Hypertension continue HCTZ, lisinopril 40, metoprolol 200 XL daily  Hyperlipidemia hold for now her statin Reflux, continue Pepcid 10 daily   Discharge Exam: Vitals:   01/04/16 2230 01/05/16 0507  BP: (!) 135/59 (!) 142/48  Pulse: 66 71  Resp: 18 18  Temp: 98.6 F (37 C) 97.2 F (36.2 C)    General: eomi ncat slighlty sleepy no acute distress Cardiovascular: s1 s2 no m/r/g Respiratory: clear no added sound  Discharge Instructions   Discharge Instructions    Diet - low sodium heart healthy    Complete by:  As directed    Discharge instructions    Complete by:  As directed    Please follow-up with your primary care physician and get a repeat chest x-ray bleeding about one month Get lab work done at your regular scheduled Would recommend continued care with oncology on an outpatient basis non-emergently your MRI was not suggestive of any specific issue such as cancer recurrence   Increase activity slowly    Complete by:  As directed      Current Discharge Medication List    START taking these medications   Details  levofloxacin (LEVAQUIN) 500 MG tablet Take 1 tablet (500 mg total) by mouth daily. Qty: 4 tablet, Refills: 0    traMADol (ULTRAM) 50 MG tablet Take 1 tablet (50 mg total) by mouth  every 6 (six) hours as needed for severe pain. Qty: 12 tablet, Refills: 0      CONTINUE these medications which have NOT CHANGED   Details  atorvastatin (LIPITOR) 20 MG tablet Take 20 mg by mouth daily.    Calcium Carbonate-Vitamin D (CALTRATE 600+D PO) Take 1 tablet by mouth daily.    hydrochlorothiazide (MICROZIDE) 12.5 MG capsule TAKE ONE CAPSULE BY MOUTH ONE TIME DAILY  Qty: 90 capsule, Refills: 0    lisinopril (PRINIVIL,ZESTRIL) 40 MG tablet TAKE ONE TABLET BY MOUTH ONE TIME DAILY   Qty: 90 tablet, Refills: 0    metoprolol (TOPROL-XL) 200 MG 24 hr tablet TAKE ONE TABLET BY MOUTH ONE TIME DAILY  Qty: 90 tablet, Refills: 0    ranitidine (ZANTAC) 75 MG tablet Take 75 mg by mouth daily as needed for heartburn.    Associated Diagnoses: Personal history of malignant neoplasm of breast       Allergies  Allergen Reactions  . Sulfa Drugs Cross Reactors Nausea Only    Made patient very ill   . Tape Hives and Itching    Breaks her out      The results of significant diagnostics from this hospitalization (including imaging, microbiology, ancillary and laboratory) are listed below for reference.    Significant Diagnostic Studies: Dg Chest 2 View  Result Date: 01/03/2016 CLINICAL DATA:  Chest pain and shortness of breath today EXAM: CHEST  2 VIEW COMPARISON:  12/23/2009 FINDINGS: Chronic cardiopericardial enlargement. Atelectasis or scarring at the bases with linear appearance. There is hyperinflation with diaphragm flattening. There is no edema, consolidation, effusion, or pneumothorax. Smooth rounded opacity the right base Bochdalek's hernia based on 2011 CT. IMPRESSION: 1. No acute finding when compared to prior. 2. Chronic cardiomegaly, basilar scarring, and hyperinflation. 3. Rounded opacity at the right base is a Bochdalek's hernia. Electronically Signed   By: Monte Fantasia M.D.   On: 01/03/2016 15:21   Ct Angio Chest Pe W And/or Wo Contrast  Result Date: 01/03/2016 CLINICAL DATA:  Left chest pain.  History of pneumonia and asthma. EXAM: CT ANGIOGRAPHY CHEST WITH CONTRAST TECHNIQUE: Multidetector CT imaging of the chest was performed using the standard protocol during bolus administration of intravenous contrast. Multiplanar CT image reconstructions and MIPs were obtained to evaluate the vascular anatomy. CONTRAST:  100 mL of Isovue 370 COMPARISON:  None. FINDINGS: Cardiovascular: Coronary artery calcifications are seen. There is a Bochdalek's hernia on the right.  Cardiomegaly is stable. Atherosclerotic change seen in the thoracic aorta. No thoracic aortic dissection or aneurysm. There is mild thickening of the esophagus which may be due to poor distention versus esophagitis. The esophagus is otherwise normal. No pulmonary emboli. Mediastinum/Nodes: The thyroid is normal in appearance. No adenopathy. Postsurgical changes in the left breast Lungs/Pleura: Central airways are normal. No pneumothorax. Bibasilar opacities have increased in the interval. The opacities are largely platelike on the right suggesting primarily atelectasis. The opacity is more focal on the left with some associated right pleural fluid. No suspicious pulmonary nodules or masses. Upper Abdomen: No acute abnormality. Musculoskeletal: Sclerosis in the T5 vertebral body is worrisome for metastatic disease. Sclerosis at the inferior endplate of T8 and superior endplate of T9 is likely degenerative. Review of the MIP images confirms the above findings. IMPRESSION: 1. No pulmonary emboli. 2. Bibasilar opacities, favored to be atelectasis on the right and worrisome for infection/infiltrate on the left. Recommend short-term follow-up to ensure resolution. 3. Sclerosis in the T5 vertebral body worrisome for metastatic disease given  the history of breast cancer. Electronically Signed   By: Dorise Bullion III M.D   On: 01/03/2016 17:16   Mr Thoracic Spine Wo Contrast  Result Date: 01/04/2016 CLINICAL DATA:  Breast cancer. Possible T5 metastasis. History of T4 metastasis. EXAM: MRI THORACIC SPINE WITHOUT CONTRAST TECHNIQUE: Multiplanar, multisequence MR imaging of the thoracic spine was performed. No intravenous contrast was administered. COMPARISON:  07/07/2009 FINDINGS: Sagittal T1 and STIR imaging and axial T2 weighted imaging with the only sequences that could be obtained before patient could not tolerate claustrophobia. T4 posterior body hypointense marrow correlating with metastatic lesions seen in 2011.  The signal changes are diminished to stable since that time. The T5 and other vertebrae are negative for lesions. Edematous and sclerotic signal around the T7 -8 disc has a degenerative appearance. Normal appearance of the cord. There is exaggerated thoracic kyphosis and diffuse usual disc degeneration. Bibasilar atelectasis. Fatty Bochdalek's hernia on the right. IMPRESSION: 1. Incomplete study due to claustrophobia, but still useful for the indication. 2. Stable to regressed known T4 vertebral body metastasis when compared to 2011. No new bone lesion. Electronically Signed   By: Monte Fantasia M.D.   On: 01/04/2016 15:20    Microbiology: Recent Results (from the past 240 hour(s))  Blood culture (routine x 2)     Status: None (Preliminary result)   Collection Time: 01/03/16  3:13 PM  Result Value Ref Range Status   Specimen Description BLOOD RIGHT ANTECUBITAL  Final   Special Requests BOTTLES DRAWN AEROBIC AND ANAEROBIC 5ML  Final   Culture   Final    NO GROWTH < 24 HOURS Performed at [facility]    Report Status PENDING  Incomplete  Blood culture (routine x 2)     Status: None (Preliminary result)   Collection Time: 01/03/16  3:36 PM  Result Value Ref Range Status   Specimen Description BLOOD RIGHT HAND  Final   Special Requests IN PEDIATRIC BOTTLE 3CC  Final   Culture   Final    NO GROWTH < 24 HOURS Performed at [facility]    Report Status PENDING  Incomplete     Labs: Basic Metabolic Panel:  Recent Labs Lab 01/03/16 1601  NA 138  K 3.7  CL 102  CO2 28  GLUCOSE 124*  BUN 12  CREATININE 0.92  CALCIUM 9.5   Liver Function Tests: No results for input(s): AST, ALT, ALKPHOS, BILITOT, PROT, ALBUMIN in the last 168 hours. No results for input(s): LIPASE, AMYLASE in the last 168 hours. No results for input(s): AMMONIA in the last 168 hours. CBC:  Recent Labs Lab 01/03/16 1509 01/04/16 1039  WBC 17.8* 9.6  NEUTROABS  --  6.2  HGB 16.3* 14.1  HCT  47.9* 42.9  MCV 96.4 97.9  PLT 287 271   Cardiac Enzymes:  Recent Labs Lab 01/03/16 1851 01/04/16 0024 01/04/16 0613  TROPONINI <0.03 <0.03 <0.03   BNP: BNP (last 3 results) No results for input(s): BNP in the last 8760 hours.  ProBNP (last 3 results) No results for input(s): PROBNP in the last 8760 hours.  CBG:  Recent Labs Lab 01/05/16 0747  GLUCAP 98       Signed:  Nita Sells MD   [facility] 01/05/2016, 9:40 AM

## 2016-01-06 LAB — LEGIONELLA PNEUMOPHILA SEROGP 1 UR AG: L. pneumophila Serogp 1 Ur Ag: NEGATIVE

## 2016-01-08 LAB — CULTURE, BLOOD (ROUTINE X 2)
Culture: NO GROWTH
Culture: NO GROWTH

## 2016-06-11 ENCOUNTER — Other Ambulatory Visit: Payer: Self-pay | Admitting: Cardiology

## 2016-06-11 ENCOUNTER — Ambulatory Visit
Admission: RE | Admit: 2016-06-11 | Discharge: 2016-06-11 | Disposition: A | Discharge status: Home / Self Care | Payer: Medicare Other | Source: Ambulatory Visit | Attending: Cardiology | Admitting: Cardiology

## 2016-06-11 DIAGNOSIS — R0602 Shortness of breath: Secondary | ICD-10-CM

## 2016-06-15 ENCOUNTER — Other Ambulatory Visit: Payer: Self-pay | Admitting: Cardiology

## 2016-06-15 DIAGNOSIS — R0602 Shortness of breath: Secondary | ICD-10-CM

## 2016-06-18 ENCOUNTER — Ambulatory Visit
Admission: RE | Admit: 2016-06-18 | Discharge: 2016-06-18 | Disposition: A | Discharge status: Home / Self Care | Payer: Medicare Other | Source: Ambulatory Visit | Attending: Cardiology | Admitting: Cardiology

## 2016-06-18 DIAGNOSIS — R0602 Shortness of breath: Secondary | ICD-10-CM

## 2016-06-18 MED ORDER — IOPAMIDOL (ISOVUE-370) INJECTION 76%
75.0000 mL | Freq: Once | INTRAVENOUS | Status: AC | PRN
  Administered 2016-06-18: 75 mL via INTRAVENOUS

## 2016-08-14 ENCOUNTER — Institutional Professional Consult (permissible substitution): Payer: Medicare Other | Admitting: Pulmonary Disease

## 2016-09-07 ENCOUNTER — Ambulatory Visit
Admission: RE | Admit: 2016-09-07 | Discharge: 2016-09-07 | Disposition: A | Discharge status: Home / Self Care | Payer: Medicare Other | Source: Ambulatory Visit | Attending: Adult Health | Admitting: Adult Health

## 2016-09-07 DIAGNOSIS — C50812 Malignant neoplasm of overlapping sites of left female breast: Secondary | ICD-10-CM

## 2016-10-07 ENCOUNTER — Encounter: Payer: Self-pay | Admitting: Adult Health

## 2016-10-07 ENCOUNTER — Ambulatory Visit (HOSPITAL_BASED_OUTPATIENT_CLINIC_OR_DEPARTMENT_OTHER): Payer: Medicare Other | Admitting: Adult Health

## 2016-10-07 ENCOUNTER — Telehealth: Payer: Self-pay

## 2016-10-07 VITALS — BP 136/63 | HR 72 | Temp 98.2°F | Resp 18 | Ht 63.0 in | Wt 230.1 lb

## 2016-10-07 DIAGNOSIS — Z853 Personal history of malignant neoplasm of breast: Secondary | ICD-10-CM

## 2016-10-07 DIAGNOSIS — M8580 Other specified disorders of bone density and structure, unspecified site: Secondary | ICD-10-CM | POA: Diagnosis not present

## 2016-10-07 DIAGNOSIS — E2839 Other primary ovarian failure: Secondary | ICD-10-CM

## 2016-10-07 DIAGNOSIS — C50812 Malignant neoplasm of overlapping sites of left female breast: Secondary | ICD-10-CM

## 2016-10-07 DIAGNOSIS — Z170 Estrogen receptor positive status [ER+]: Principal | ICD-10-CM

## 2016-10-07 NOTE — Progress Notes (Signed)
CLINIC:  Survivorship   REASON FOR VISIT:  Routine follow-up for history of breast cancer.   BRIEF ONCOLOGIC HISTORY:    Cancer of overlapping sites of left female breast ([place])   06/10/2009 Initial Biopsy    (L) breast needle core biopsy (2:30): IDC with abundant extracellular mucin, grade 2. (L) axillary LN: (+) metastatic carcinoma.       06/10/2009 Breast US    Irregular, hypoechoic mass in (L) breast at 2:30 position, 7 cm from nipple.  Mass measures 1.9 x 1.8 x 1.4 cm. There are 2 abnormal appearing LNs, largest measuring 1.3 cm, with loss of normal fatty hilum.       06/10/2009 Initial Diagnosis    Cancer of overlapping sites of left female breast ([place])      06/10/2009 Mammogram     2.2 cm spiculated mass in (L) breast at 2:30 with worrisome pleomorphic microcalcs extending inferiorly & posteriorly for a 7 cm distance. Several abnormal appearing lower (L) axillary LNs worrisome for possible mets.       06/25/2009 PET scan    Evidence of primary (L) breast cancer & (L) axillary nodal mets. No evidence of central nodal mets, pulmonary mets, or distant soft tissue mets. Focal fairly intense uptake within T4 vertebral body, concerning for (+) mets. Recommend MRI to confirm.       06/25/2009 Imaging    CT c/a/p: Evidence of (L) axillary nodal mets. No evidence of central nodal, mediastinal, or pulmonary mets.  No evidence of abdominal or pelvic mets. No CT evidence of skeletal mets. There is concern for thoracic mets on comparison PET CT.       07/07/2009 Imaging    MRI thoracic spine: Findings compatible with bone mets at T4. No other evidence of metastatic disease identitied.       07/17/2009 Surgery    (L) lumpectomy with ALND:       07/17/2009 Pathology Results    IDC with extracellular mucin, grade 2, multifocal, largest lesion 2.2 cm; DCIS, high-grade with calcs & focal necrosis. LVI (+).  1/8 axillary LNs (+) for metastatic carcinoma. ER+ (100%), PR+ (4%), HER2 neg (ratio  1.40), Ki67 4%; Margins close       07/17/2009 Pathologic Stage    pT2, pN1a: Stage IIB      07/30/2009 Procedure    T4 bone biopsy: No metastatic malignancy or increased plasma cells.       08/29/2009 -  Chemotherapy    TC x 1 cycle which resulted in patient being briefly hospitalized.       10/17/2009 -  Chemotherapy    CMF x 1 cycle with resulted in patient being hospitalized.       12/02/2009 Procedure    (L) breast needle core biopsy: DCIS with calcs. ER+ (100%), PR + (89%)      12/23/2009 Surgery    Re-excision: IDC with extracellular mucin, grade 1, microscopic focus. LVI (+). DCIS, grade 2, spanning at least 1.1 cm; DCIS is focally present at surgical resection margin; also focus of Jansen.      02/03/2010 - 03/25/2010 Radiation Therapy    Adjuvant radiation Tammi Klippel). Left breast: 50.4 Gy in 28 fractions. Left breast boost: 10 Gy in 5 fractions.       03/2010 - 12/2011 Anti-estrogen oral therapy    Arimidex started.       12/2011 - 2017 Anti-estrogen oral therapy    Therapy changed to Femara. (5 years of therapy completed). Dr. Jana Hakim did not  recommend continued therapy beyond 5 years.         INTERVAL HISTORY:  Sarah Burgess presents to the [facility] today for routine follow-up for her history of breast cancer.  Overall, she reports feeling quite well. She denies any issues today other than joint aches and pains.  She sees her PCP regularly.  She is beginning an exercise regimen.      REVIEW OF SYSTEMS:  Review of Systems  Constitutional: Negative for appetite change, chills, fatigue, fever and unexpected weight change.  HENT:   Negative for hearing loss and lump/mass.   Eyes: Negative for eye problems and icterus.  Respiratory: Negative for chest tightness, cough and shortness of breath.   Cardiovascular: Negative for chest pain, leg swelling and palpitations.  Gastrointestinal: Negative for abdominal distention, abdominal pain, constipation, diarrhea,  nausea and vomiting.  Endocrine: Negative for hot flashes.  Genitourinary: Negative for difficulty urinating.   Musculoskeletal: Positive for arthralgias.  Skin: Negative for itching and rash.  Neurological: Negative for dizziness, extremity weakness, headaches and numbness.  Hematological: Negative for adenopathy. Does not bruise/bleed easily.  Psychiatric/Behavioral: Negative for depression. The patient is not nervous/anxious.   Breast: Denies any new nodularity, masses, tenderness, nipple changes, or nipple discharge.       PAST MEDICAL/SURGICAL HISTORY:  Past Medical History:  Diagnosis Date  . Allergy   . Arthritis   . Asthma   . Cancer [facility])    left breast and thyroid   . Diverticulitis   . GERD (gastroesophageal reflux disease)   . Hemorrhoids   . Hernia   . Pneumonia   . Thyroid disease    Past Surgical History:  Procedure Laterality Date  . ABDOMINAL HYSTERECTOMY    . BREAST LUMPECTOMY  2011   left breast  . CHOLECYSTECTOMY    . PORT-A-CATH REMOVAL  02/23/2011   Procedure: REMOVAL PORT-A-CATH;  Surgeon: Haywood Lasso, MD;  Location: [facility];  Service: General;  Laterality: N/A;  remove access port right chest     ALLERGIES:  Allergies  Allergen Reactions  . Sulfa Drugs Cross Reactors Nausea Only    Made patient very ill   . Tape Hives and Itching    Breaks her out  . Tramadol Itching     CURRENT MEDICATIONS:  Outpatient Encounter Prescriptions as of 10/07/2016  Medication Sig  . furosemide (LASIX) 20 MG tablet Take 20 mg by mouth daily.  Marland Kitchen atorvastatin (LIPITOR) 20 MG tablet Take 20 mg by mouth daily.  . Calcium Carbonate-Vitamin D (CALTRATE 600+D PO) Take 1 tablet by mouth daily.  . hydrochlorothiazide (MICROZIDE) 12.5 MG capsule TAKE ONE CAPSULE BY MOUTH ONE TIME DAILY   . levofloxacin (LEVAQUIN) 500 MG tablet Take 1 tablet (500 mg total) by mouth daily. (Patient not taking: Reported on 10/07/2016)  . lisinopril  (PRINIVIL,ZESTRIL) 40 MG tablet TAKE ONE TABLET BY MOUTH ONE TIME DAILY   . metoprolol (TOPROL-XL) 200 MG 24 hr tablet TAKE ONE TABLET BY MOUTH ONE TIME DAILY   . ranitidine (ZANTAC) 75 MG tablet Take 75 mg by mouth daily as needed for heartburn.   . traMADol (ULTRAM) 50 MG tablet Take 1 tablet (50 mg total) by mouth every 6 (six) hours as needed for severe pain. (Patient not taking: Reported on 10/07/2016)   No facility-administered encounter medications on file as of 10/07/2016.      ONCOLOGIC FAMILY HISTORY:  Family History  Problem Relation Age of Onset  . Stroke Mother   .  Heart disease Mother   . Early death Mother   . Diabetes Mother   . Cancer Father        pt unaware of what kind  . Heart disease Father   . Diabetes Father   . Heart disease Sister   . Diabetes Sister   . Arthritis Son      SOCIAL HISTORY:  Sarah Burgess is widowed and lives with her son in [place], [place].  She has another son who lives in [place], he is in a nursing home after having leg amputation.  Sarah Burgess is currently retired.  She denies any current or history of tobacco, alcohol, or illicit drug use.     PHYSICAL EXAMINATION:  Vital Signs: Vitals:   10/07/16 1044  BP: 136/63  Pulse: 72  Resp: 18  Temp: 98.2 F (36.8 C)   Filed Weights   10/07/16 1044  Weight: 230 lb 1.6 oz (104.4 kg)   General: Well-nourished, well-appearing female in no acute distress.  Unaccompanied today.   HEENT: Head is normocephalic.  Pupils equal and reactive to light. Conjunctivae clear without exudate.  Sclerae anicteric. Oral mucosa is pink, moist.  Oropharynx is pink without lesions or erythema.  Lymph: No cervical, supraclavicular, or infraclavicular lymphadenopathy noted on palpation.  Cardiovascular: Regular rate and rhythm.Marland Kitchen Respiratory: Clear to auscultation bilaterally. Chest expansion symmetric; breathing non-labored.  Breast Exam:  -Left breast: No appreciable masses on palpation. No  skin redness, thickening, or peau d'orange appearance; no nipple retraction or nipple discharge;  distortion in symmetry at previous lumpectomy site well healed scar without erythema or nodularity.  -Right breast: No appreciable masses on palpation. No skin redness, thickening, or peau d'orange appearance; no nipple retraction or nipple discharge; -Axilla: No axillary adenopathy bilaterally.  GI: Abdomen soft and round; non-tender, non-distended. Bowel sounds normoactive. No hepatosplenomegaly.   GU: Deferred.  Neuro: No focal deficits. Steady gait.  Psych: Mood and affect normal and appropriate for situation.  MSK: No focal spinal tenderness to palpation, full range of motion in bilateral upper extremities Extremities: No edema. Skin: Warm and dry.  LABORATORY DATA:  None for this visit   DIAGNOSTIC IMAGING:  Most recent mammogram:      ASSESSMENT AND PLAN:  Ms.. Lamba is a pleasant 76 y.o. female with history of Stage IIB left breast invasive ductal carcinoma, ER+/PR+/HER2-, diagnosed in 2011, treated with lumpectomy, adjuvant chemotherapy (unable to tolerate), adjuvant radiation therapy, and anti-estrogen therapy with Anastrozole and Letrozole for a total of 5 years of therapy.  She presents to the [facility] for surveillance and routine follow-up.   1. History of breast cancer:  Ms. Crumpler is currently clinically and radiographically without evidence of disease or recurrence of breast cancer. She will be due for mammogram in 08/2017.   I encouraged her to call me with any questions or concerns before her next visit at the cancer center, and I would be happy to see her sooner, if needed.    2. Bone health:  Given Ms. Hannen's age, history of breast cancer, and her Previous anti-estrogen therapy with Anastrozole and Letrozole, she is at risk for bone demineralization. Her last DEXA scan was on 08/15/2013 and was consistent with osteopenia.  She was encouraged to increase her  consumption of foods rich in calcium, as well as increase her weight-bearing activities.  She was given education on specific food and activities to promote bone health.  3. Cancer screening:  Due to Ms. Prevo's history and her  age, she should receive screening for skin cancers, colon cancer. She was encouraged to follow-up with her PCP for appropriate cancer screenings.   4. Health maintenance and wellness promotion: Ms. Garraway was encouraged to consume 5-7 servings of fruits and vegetables per day. She was also encouraged to engage in moderate to vigorous exercise for 30 minutes per day most days of the week. She was instructed to limit her alcohol consumption and continue to abstain from tobacco use.    Dispo:  -Return to cancer center in one year for LTS follow up -Bone density -Mammogram in 08/2017   A total of (30) minutes of face-to-face time was spent with this patient with greater than 50% of that time in counseling and care-coordination.   Gardenia Phlegm, [place] [phone]   Note: PRIMARY CARE PROVIDER Bernerd Limbo, [place] [phone]

## 2016-10-07 NOTE — Telephone Encounter (Signed)
appts made and avs printed for pateint 

## 2016-10-07 NOTE — Addendum Note (Signed)
Addended by: Wilber Bihari C on: 10/07/2016 11:06 AM   Modules accepted: Orders

## 2016-10-08 ENCOUNTER — Encounter: Payer: Medicare Other | Admitting: Adult Health

## 2016-10-21 ENCOUNTER — Ambulatory Visit
Admission: RE | Admit: 2016-10-21 | Discharge: 2016-10-21 | Disposition: A | Discharge status: Home / Self Care | Payer: Medicare Other | Source: Ambulatory Visit | Attending: Adult Health | Admitting: Adult Health

## 2016-10-21 DIAGNOSIS — E2839 Other primary ovarian failure: Secondary | ICD-10-CM

## 2016-10-26 ENCOUNTER — Telehealth: Payer: Self-pay | Admitting: Emergency Medicine

## 2016-10-26 NOTE — Telephone Encounter (Signed)
Patient returned my call. Discussed bone density scan results with patient and recommend she remain taking her calcium and vit d per Wilber Bihari NP. Also recommended weight bearing exercises.

## 2016-10-26 NOTE — Telephone Encounter (Signed)
Voicemail left in regards to bone density scan. Requested patient to call this nurse back.

## 2017-05-26 NOTE — Progress Notes (Unsigned)
This encounter was created in error - please disregard.

## 2017-05-31 NOTE — Progress Notes (Signed)
This encounter was created in error - please disregard.

## 2017-10-11 ENCOUNTER — Inpatient Hospital Stay: Payer: Medicare Other | Attending: Adult Health | Admitting: Adult Health

## 2017-10-13 ENCOUNTER — Other Ambulatory Visit: Payer: Self-pay | Admitting: Family Medicine

## 2017-10-13 ENCOUNTER — Other Ambulatory Visit: Payer: Self-pay | Admitting: Adult Health

## 2017-10-13 DIAGNOSIS — Z1231 Encounter for screening mammogram for malignant neoplasm of breast: Secondary | ICD-10-CM

## 2017-10-29 ENCOUNTER — Telehealth: Payer: Self-pay

## 2017-10-29 NOTE — Telephone Encounter (Signed)
PER 8/9 PHONE QUE ALSO MAILED A LETTER WITH A CALENDER ENCLOSED

## 2017-10-29 NOTE — Telephone Encounter (Signed)
SPOKE WIITH PATIENT AND R/S HER MISSED APPOINTMENT WITH [facility]. PER 8/9 PHONE QUE

## 2017-11-03 ENCOUNTER — Ambulatory Visit
Admission: RE | Admit: 2017-11-03 | Discharge: 2017-11-03 | Disposition: A | Discharge status: Home / Self Care | Payer: Medicare Other | Source: Ambulatory Visit | Attending: Family Medicine | Admitting: Family Medicine

## 2017-11-03 DIAGNOSIS — Z1231 Encounter for screening mammogram for malignant neoplasm of breast: Secondary | ICD-10-CM

## 2017-11-04 ENCOUNTER — Encounter: Payer: Self-pay | Admitting: Adult Health

## 2017-11-04 ENCOUNTER — Telehealth: Payer: Self-pay | Admitting: Adult Health

## 2017-11-04 ENCOUNTER — Inpatient Hospital Stay: Payer: Medicare Other | Attending: Adult Health | Admitting: Adult Health

## 2017-11-04 VITALS — BP 134/84 | HR 69 | Temp 97.8°F | Resp 17 | Ht 63.0 in | Wt 234.1 lb

## 2017-11-04 DIAGNOSIS — Z923 Personal history of irradiation: Secondary | ICD-10-CM

## 2017-11-04 DIAGNOSIS — R32 Unspecified urinary incontinence: Secondary | ICD-10-CM | POA: Diagnosis not present

## 2017-11-04 DIAGNOSIS — Z9223 Personal history of estrogen therapy: Secondary | ICD-10-CM

## 2017-11-04 DIAGNOSIS — M1990 Unspecified osteoarthritis, unspecified site: Secondary | ICD-10-CM | POA: Diagnosis not present

## 2017-11-04 DIAGNOSIS — Z79899 Other long term (current) drug therapy: Secondary | ICD-10-CM

## 2017-11-04 DIAGNOSIS — M8580 Other specified disorders of bone density and structure, unspecified site: Secondary | ICD-10-CM

## 2017-11-04 DIAGNOSIS — Z853 Personal history of malignant neoplasm of breast: Secondary | ICD-10-CM | POA: Diagnosis not present

## 2017-11-04 DIAGNOSIS — K219 Gastro-esophageal reflux disease without esophagitis: Secondary | ICD-10-CM

## 2017-11-04 DIAGNOSIS — C50812 Malignant neoplasm of overlapping sites of left female breast: Secondary | ICD-10-CM

## 2017-11-04 DIAGNOSIS — Z9221 Personal history of antineoplastic chemotherapy: Secondary | ICD-10-CM

## 2017-11-04 DIAGNOSIS — E079 Disorder of thyroid, unspecified: Secondary | ICD-10-CM

## 2017-11-04 DIAGNOSIS — Z87891 Personal history of nicotine dependence: Secondary | ICD-10-CM

## 2017-11-04 DIAGNOSIS — J45909 Unspecified asthma, uncomplicated: Secondary | ICD-10-CM

## 2017-11-04 NOTE — Progress Notes (Signed)
CLINIC:  Survivorship   REASON FOR VISIT:  Routine follow-up for history of breast cancer.   BRIEF ONCOLOGIC HISTORY:    Cancer of overlapping sites of left female breast ([place])   06/10/2009 Initial Biopsy    (L) breast needle core biopsy (2:30): IDC with abundant extracellular mucin, grade 2. (L) axillary LN: (+) metastatic carcinoma.     06/10/2009 Breast US    Irregular, hypoechoic mass in (L) breast at 2:30 position, 7 cm from nipple.  Mass measures 1.9 x 1.8 x 1.4 cm. There are 2 abnormal appearing LNs, largest measuring 1.3 cm, with loss of normal fatty hilum.     06/10/2009 Initial Diagnosis    Cancer of overlapping sites of left female breast ([place])    06/10/2009 Mammogram     2.2 cm spiculated mass in (L) breast at 2:30 with worrisome pleomorphic microcalcs extending inferiorly & posteriorly for a 7 cm distance. Several abnormal appearing lower (L) axillary LNs worrisome for possible mets.     06/25/2009 PET scan    Evidence of primary (L) breast cancer & (L) axillary nodal mets. No evidence of central nodal mets, pulmonary mets, or distant soft tissue mets. Focal fairly intense uptake within T4 vertebral body, concerning for (+) mets. Recommend MRI to confirm.     06/25/2009 Imaging    CT c/a/p: Evidence of (L) axillary nodal mets. No evidence of central nodal, mediastinal, or pulmonary mets.  No evidence of abdominal or pelvic mets. No CT evidence of skeletal mets. There is concern for thoracic mets on comparison PET CT.     07/07/2009 Imaging    MRI thoracic spine: Findings compatible with bone mets at T4. No other evidence of metastatic disease identitied.     07/17/2009 Surgery    (L) lumpectomy with ALND:     07/17/2009 Pathology Results    IDC with extracellular mucin, grade 2, multifocal, largest lesion 2.2 cm; DCIS, high-grade with calcs & focal necrosis. LVI (+).  1/8 axillary LNs (+) for metastatic carcinoma. ER+ (100%), PR+ (4%), HER2 neg (ratio 1.40), Ki67 4%; Margins  close     07/17/2009 Pathologic Stage    pT2, pN1a: Stage IIB    07/30/2009 Procedure    T4 bone biopsy: No metastatic malignancy or increased plasma cells.     08/29/2009 -  Chemotherapy    TC x 1 cycle which resulted in patient being briefly hospitalized.     10/17/2009 -  Chemotherapy    CMF x 1 cycle with resulted in patient being hospitalized.     12/02/2009 Procedure    (L) breast needle core biopsy: DCIS with calcs. ER+ (100%), PR + (89%)    12/23/2009 Surgery    Re-excision: IDC with extracellular mucin, grade 1, microscopic focus. LVI (+). DCIS, grade 2, spanning at least 1.1 cm; DCIS is focally present at surgical resection margin; also focus of [place].    02/03/2010 - 03/25/2010 Radiation Therapy    Adjuvant radiation Sarah Burgess). Left breast: 50.4 Gy in 28 fractions. Left breast boost: 10 Gy in 5 fractions.     03/2010 - 12/2011 Anti-estrogen oral therapy    Arimidex started.     12/2011 - 2017 Anti-estrogen oral therapy    Therapy changed to Femara. (5 years of therapy completed). Sarah Burgess did not recommend continued therapy beyond 5 years.       INTERVAL HISTORY:  Sarah Burgess presents to the [facility] today for routine follow-up for her history of breast cancer.  Overall,  she reports feeling quite well.   Sarah Burgess tells me that she is getting older.  She has become more incontinent.  She has seen her PCP about this.  Her feet occasionally swell.  She is doing well otherwise.    She does not exercise due to challenges with her knees.  She used to see ortho, however hasn't due to her son's unexpected death on Mother's day.    She is up to date with her cancer screenings.      REVIEW OF SYSTEMS:  Review of Systems  Constitutional: Negative for appetite change, chills, fatigue, fever and unexpected weight change.  Eyes: Negative for eye problems and icterus.  Respiratory: Negative for chest tightness, cough and shortness of breath.   Cardiovascular: Negative for  chest pain, leg swelling and palpitations.  Gastrointestinal: Negative for abdominal distention, abdominal pain, constipation, diarrhea, nausea and vomiting.  Endocrine: Negative for hot flashes.  Skin: Negative for itching and rash.  Neurological: Negative for dizziness and numbness.  Hematological: Negative for adenopathy. Does not bruise/bleed easily.  Psychiatric/Behavioral: Negative for depression. The patient is not nervous/anxious.   Breast: Denies any new nodularity, masses, tenderness, nipple changes, or nipple discharge.       PAST MEDICAL/SURGICAL HISTORY:  Past Medical History:  Diagnosis Date  . Allergy   . Arthritis   . Asthma   . Cancer [facility])    left breast and thyroid   . Diverticulitis   . GERD (gastroesophageal reflux disease)   . Hemorrhoids   . Hernia   . Pneumonia   . Thyroid disease    Past Surgical History:  Procedure Laterality Date  . ABDOMINAL HYSTERECTOMY    . BREAST LUMPECTOMY  2011   left breast  . CHOLECYSTECTOMY    . PORT-A-CATH REMOVAL  02/23/2011   Procedure: REMOVAL PORT-A-CATH;  Surgeon: Sarah Lasso, MD;  Location: [facility];  Service: General;  Laterality: N/A;  remove access port right chest     ALLERGIES:  Allergies  Allergen Reactions  . Sulfa Drugs Cross Reactors Nausea Only    Made patient very ill   . Tape Hives and Itching    Breaks her out  . Tramadol Itching     CURRENT MEDICATIONS:  Outpatient Encounter Medications as of 11/04/2017  Medication Sig  . atorvastatin (LIPITOR) 20 MG tablet Take 20 mg by mouth daily.  . Calcium Carbonate-Vitamin D (CALTRATE 600+D PO) Take 1 tablet by mouth daily.  . hydrochlorothiazide (MICROZIDE) 12.5 MG capsule TAKE ONE CAPSULE BY MOUTH ONE TIME DAILY   . lisinopril (PRINIVIL,ZESTRIL) 40 MG tablet TAKE ONE TABLET BY MOUTH ONE TIME DAILY   . metoprolol (TOPROL-XL) 200 MG 24 hr tablet TAKE ONE TABLET BY MOUTH ONE TIME DAILY   . ranitidine (ZANTAC) 75 MG tablet Take  75 mg by mouth daily as needed for heartburn.   . solifenacin (VESICARE) 5 MG tablet Take 5 mg by mouth daily.  . [DISCONTINUED] furosemide (LASIX) 20 MG tablet Take 20 mg by mouth daily.  . [DISCONTINUED] levofloxacin (LEVAQUIN) 500 MG tablet Take 1 tablet (500 mg total) by mouth daily. (Patient not taking: Reported on 10/07/2016)  . [DISCONTINUED] traMADol (ULTRAM) 50 MG tablet Take 1 tablet (50 mg total) by mouth every 6 (six) hours as needed for severe pain. (Patient not taking: Reported on 10/07/2016)   No facility-administered encounter medications on file as of 11/04/2017.      ONCOLOGIC FAMILY HISTORY:  Family History  Problem Relation Age of Onset  .  Stroke Mother   . Heart disease Mother   . Early death Mother   . Diabetes Mother   . Cancer Father        pt unaware of what kind  . Heart disease Father   . Diabetes Father   . Heart disease Sister   . Diabetes Sister   . Arthritis Son     GENETIC COUNSELING/TESTING: Not at this time  SOCIAL HISTORY:    Social History   Socioeconomic History  . Marital status: Widowed    Spouse name: Not on file  . Number of children: Not on file  . Years of education: Not on file  . Highest education level: Not on file  Occupational History  . Not on file  Social Needs  . Financial resource strain: Not on file  . Food insecurity:    Worry: Not on file    Inability: Not on file  . Transportation needs:    Medical: Not on file    Non-medical: Not on file  Tobacco Use  . Smoking status: Former Smoker    Last attempt to quit: 01/03/2000    Years since quitting: 17.8  . Smokeless tobacco: Never Used  Substance and Sexual Activity  . Alcohol use: No  . Drug use: No  . Sexual activity: Never    Birth control/protection: Post-menopausal  Lifestyle  . Physical activity:    Days per week: Not on file    Minutes per session: Not on file  . Stress: Not on file  Relationships  . Social connections:    Talks on phone: Not on  file    Gets together: Not on file    Attends religious service: Not on file    Active member of club or organization: Not on file    Attends meetings of clubs or organizations: Not on file    Relationship status: Not on file  . Intimate partner violence:    Fear of current or ex partner: Not on file    Emotionally abused: Not on file    Physically abused: Not on file    Forced sexual activity: Not on file  Other Topics Concern  . Not on file  Social History Narrative  . Not on file      PHYSICAL EXAMINATION:  Vital Signs: Vitals:   11/04/17 1506  BP: 134/84  Pulse: 69  Resp: 17  Temp: 97.8 F (36.6 C)  SpO2: 93%   Filed Weights   11/04/17 1506  Weight: 234 lb 1.6 oz (106.2 kg)   General: Well-nourished, well-appearing female in no acute distress.  Unaccompanied today.   HEENT: Head is normocephalic.  Pupils equal and reactive to light. Conjunctivae clear without exudate.  Sclerae anicteric. Oral mucosa is pink, moist.  Oropharynx is pink without lesions or erythema.  Lymph: No cervical, supraclavicular, or infraclavicular lymphadenopathy noted on palpation.  Cardiovascular: Regular rate and rhythm.. Respiratory: Clear to auscultation bilaterally. Chest expansion symmetric; breathing non-labored.  Breast Exam:  -Left breast: No appreciable masses on palpation. No skin redness, thickening, or peau d'orange appearance; no nipple retraction or nipple discharge; moderate distortion in symmetry at previous lumpectomy site well healed scar without erythema or nodularity.  -Axilla: No axillary adenopathy bilaterally.  GI: Abdomen soft and round; non-tender, non-distended. Bowel sounds normoactive. No hepatosplenomegaly.   GU: Deferred.  Neuro: No focal deficits. Steady gait.  Psych: Mood and affect normal and appropriate for situation.  MSK: No focal spinal tenderness to palpation, full range of   motion in bilateral upper extremities Extremities: No edema. Skin: Warm and  dry.  LABORATORY DATA:  None for this visit   DIAGNOSTIC IMAGING:  Most recent mammogram:      ASSESSMENT AND PLAN:  Ms.. Burgess is a pleasant 77 y.o. female with history of Stage IIB left breast invasive ductal carcinoma, ER+/PR+/HER2-, diagnosed in 2011, treated with lumpectomy, adjuvant chemotherapy adjuvant radiation therapy, and anti-estrogen therapy with Anastrozole followed by Sarah Burgess completing 5 years of therapy in 2017.  She presents to the [facility]orship [facility] for surveillance and routine follow-up.   1. History of breast cancer:  Sarah Burgess is currently clinically and radiographically without evidence of disease or recurrence of breast cancer. She will be due for mammogram in 10/2018; orders placed today.  We will see her back in 1 year for LTS follow up.  I encouraged her to call me with any questions or concerns before her next visit at the cancer center, and I would be happy to see her sooner, if needed.    2. Bone health:  Given Sarah Burgess's age, history of breast cancer, and previous use of aromatase inhibitors, she is at risk for bone demineralization.  She was given education on specific food and activities to promote bone health.  3. Cancer screening:  Due to Sarah Burgess's history and her age, she should receive screening for skin cancers, colon cancer. She was encouraged to follow-up with her PCP for appropriate cancer screenings.   4. Health maintenance and wellness promotion: Sarah Burgess was encouraged to consume 5-7 servings of fruits and vegetables per day. She was also encouraged to engage in moderate to vigorous exercise for 30 minutes per day most days of the week. She was instructed to limit her alcohol consumption and continue to abstain from tobacco use.     Dispo:  -Return to cancer center in one year for LTS follow up -Mammogram due in 10/2018   A total of (30) minutes of face-to-face time was spent with this patient with greater than 50% of that time  in counseling and care-coordination.   Sarah Burgess, [place] [phone]   Note: PRIMARY CARE PROVIDER Bernerd Limbo, [place] [phone]

## 2017-11-04 NOTE — Patient Instructions (Signed)
Bone Health Bones protect organs, store calcium, and anchor muscles. Good health habits, such as eating nutritious foods and exercising regularly, are important for maintaining healthy bones. They can also help to prevent a condition that causes bones to lose density and become weak and brittle (osteoporosis). Why is bone mass important? Bone mass refers to the amount of bone tissue that you have. The higher your bone mass, the stronger your bones. An important step toward having healthy bones throughout life is to have strong and dense bones during childhood. A young adult who has a high bone mass is more likely to have a high bone mass later in life. Bone mass at its greatest it is called peak bone mass. A large decline in bone mass occurs in older adults. In women, it occurs about the time of menopause. During this time, it is important to practice good health habits, because if more bone is lost than what is replaced, the bones will become less healthy and more likely to break (fracture). If you find that you have a low bone mass, you may be able to prevent osteoporosis or further bone loss by changing your diet and lifestyle. How can I find out if my bone mass is low? Bone mass can be measured with an X-ray test that is called a bone mineral density (BMD) test. This test is recommended for all women who are age 65 or older. It may also be recommended for men who are age 70 or older, or for people who are more likely to develop osteoporosis due to:  Having bones that break easily.  Having a long-term disease that weakens bones, such as kidney disease or rheumatoid arthritis.  Having menopause earlier than normal.  Taking medicine that weakens bones, such as steroids, thyroid hormones, or hormone treatment for breast cancer or prostate cancer.  Smoking.  Drinking three or more alcoholic drinks each day.  What are the nutritional recommendations for healthy bones? To have healthy bones, you  need to get enough of the right minerals and vitamins. Most nutrition experts recommend getting these nutrients from the foods that you eat. Nutritional recommendations vary from person to person. Ask your health care provider what is healthy for you. Here are some general guidelines. Calcium Recommendations Calcium is the most important (essential) mineral for bone health. Most people can get enough calcium from their diet, but supplements may be recommended for people who are at risk for osteoporosis. Good sources of calcium include:  Dairy products, such as low-fat or nonfat milk, cheese, and yogurt.  Dark green leafy vegetables, such as bok choy and broccoli.  Calcium-fortified foods, such as orange juice, cereal, bread, soy beverages, and tofu products.  Nuts, such as almonds.  Follow these recommended amounts for daily calcium intake:  Children, age 1?3: 700 mg.  Children, age 4?8: 1,000 mg.  Children, age 9?13: 1,300 mg.  Teens, age 14?18: 1,300 mg.  Adults, age 19?50: 1,000 mg.  Adults, age 51?70: ? Men: 1,000 mg. ? Women: 1,200 mg.  Adults, age 71 or older: 1,200 mg.  Pregnant and breastfeeding females: ? Teens: 1,300 mg. ? Adults: 1,000 mg.  Vitamin D Recommendations Vitamin D is the most essential vitamin for bone health. It helps the body to absorb calcium. Sunlight stimulates the skin to make vitamin D, so be sure to get enough sunlight. If you live in a cold climate or you do not get outside often, your health care provider may recommend that you take vitamin   D supplements. Good sources of vitamin D in your diet include:  Egg yolks.  Saltwater fish.  Milk and cereal fortified with vitamin D.  Follow these recommended amounts for daily vitamin D intake:  Children and teens, age 1?18: 600 international units.  Adults, age 50 or younger: 400-800 international units.  Adults, age 51 or older: 800-1,000 international units.  Other Nutrients Other nutrients  for bone health include:  Phosphorus. This mineral is found in meat, poultry, dairy foods, nuts, and legumes. The recommended daily intake for adult men and adult women is 700 mg.  Magnesium. This mineral is found in seeds, nuts, dark green vegetables, and legumes. The recommended daily intake for adult men is 400?420 mg. For adult women, it is 310?320 mg.  Vitamin K. This vitamin is found in green leafy vegetables. The recommended daily intake is 120 mg for adult men and 90 mg for adult women.  What type of physical activity is best for building and maintaining healthy bones? Weight-bearing and strength-building activities are important for building and maintaining peak bone mass. Weight-bearing activities cause muscles and bones to work against gravity. Strength-building activities increases muscle strength that supports bones. Weight-bearing and muscle-building activities include:  Walking and hiking.  Jogging and running.  Dancing.  Gym exercises.  Lifting weights.  Tennis and racquetball.  Climbing stairs.  Aerobics.  Adults should get at least 30 minutes of moderate physical activity on most days. Children should get at least 60 minutes of moderate physical activity on most days. Ask your health care provide what type of exercise is best for you. Where can I find more information? For more information, check out the following websites:  National Osteoporosis Foundation: http://nof.org/learn/basics  National Institutes of Health: http://www.niams.nih.gov/Health_Info/Bone/Bone_Health/bone_health_for_life.asp  This information is not intended to replace advice given to you by your health care provider. Make sure you discuss any questions you have with your health care provider. Document Released: 05/30/2003 Document Revised: 09/27/2015 Document Reviewed: 03/14/2014 Elsevier Interactive Patient Education  2018 [employer].  

## 2017-11-04 NOTE — Telephone Encounter (Signed)
Gave avs and calendar ° °

## 2018-09-26 ENCOUNTER — Other Ambulatory Visit: Payer: Self-pay | Admitting: Family Medicine

## 2018-09-26 DIAGNOSIS — Z1231 Encounter for screening mammogram for malignant neoplasm of breast: Secondary | ICD-10-CM

## 2018-10-07 ENCOUNTER — Telehealth: Payer: Self-pay | Admitting: Adult Health

## 2018-10-07 NOTE — Telephone Encounter (Signed)
[facility] Blocked 8/17 @ 3 pm moved LTS visit from 8/17 to 8/20. Date/time due to patient has other appointments 8/17 and  next LTS visit too far out. Left message for patient. Schedule mailed.

## 2018-11-07 ENCOUNTER — Other Ambulatory Visit: Payer: Self-pay

## 2018-11-07 ENCOUNTER — Encounter: Payer: Medicare Other | Admitting: Adult Health

## 2018-11-07 ENCOUNTER — Ambulatory Visit
Admission: RE | Admit: 2018-11-07 | Discharge: 2018-11-07 | Disposition: A | Discharge status: Home / Self Care | Payer: Medicare Other | Source: Ambulatory Visit | Attending: Family Medicine | Admitting: Family Medicine

## 2018-11-07 DIAGNOSIS — Z1231 Encounter for screening mammogram for malignant neoplasm of breast: Secondary | ICD-10-CM

## 2018-11-07 HISTORY — DX: Personal history of antineoplastic chemotherapy: Z92.21

## 2018-11-07 HISTORY — DX: Personal history of irradiation: Z92.3

## 2018-11-09 ENCOUNTER — Telehealth: Payer: Self-pay

## 2018-11-09 NOTE — Telephone Encounter (Signed)
Left vm for pt regarding prescreening questions for appointment on 8/20. ° °

## 2018-11-10 ENCOUNTER — Inpatient Hospital Stay: Payer: Medicare Other | Attending: Adult Health | Admitting: Adult Health

## 2019-01-03 ENCOUNTER — Other Ambulatory Visit: Payer: Self-pay | Admitting: Family Medicine

## 2019-01-03 DIAGNOSIS — R2232 Localized swelling, mass and lump, left upper limb: Secondary | ICD-10-CM

## 2019-01-06 ENCOUNTER — Other Ambulatory Visit: Payer: Self-pay

## 2019-01-06 ENCOUNTER — Ambulatory Visit
Admission: RE | Admit: 2019-01-06 | Discharge: 2019-01-06 | Disposition: A | Discharge status: Home / Self Care | Payer: Medicare Other | Source: Ambulatory Visit | Attending: Family Medicine | Admitting: Family Medicine

## 2019-01-06 DIAGNOSIS — R2232 Localized swelling, mass and lump, left upper limb: Secondary | ICD-10-CM

## 2019-09-28 ENCOUNTER — Other Ambulatory Visit: Payer: Self-pay | Admitting: Family Medicine

## 2019-09-28 DIAGNOSIS — Z1231 Encounter for screening mammogram for malignant neoplasm of breast: Secondary | ICD-10-CM

## 2019-11-09 ENCOUNTER — Ambulatory Visit
Admission: RE | Admit: 2019-11-09 | Discharge: 2019-11-09 | Disposition: A | Discharge status: Home / Self Care | Payer: Medicare Other | Source: Ambulatory Visit | Attending: Family Medicine | Admitting: Family Medicine

## 2019-11-09 ENCOUNTER — Other Ambulatory Visit: Payer: Self-pay

## 2019-11-09 DIAGNOSIS — Z1231 Encounter for screening mammogram for malignant neoplasm of breast: Secondary | ICD-10-CM

## 2019-11-09 HISTORY — DX: Malignant neoplasm of unspecified site of unspecified female breast: C50.919

## 2019-11-15 ENCOUNTER — Emergency Department (HOSPITAL_COMMUNITY)
Admission: EM | Admit: 2019-11-15 | Discharge: 2019-11-15 | Disposition: A | Discharge status: Home / Self Care | Payer: Medicare Other | Attending: Emergency Medicine | Admitting: Emergency Medicine

## 2019-11-15 ENCOUNTER — Other Ambulatory Visit: Payer: Self-pay

## 2019-11-15 ENCOUNTER — Encounter (HOSPITAL_COMMUNITY): Payer: Self-pay | Admitting: Emergency Medicine

## 2019-11-15 ENCOUNTER — Emergency Department (HOSPITAL_COMMUNITY): Payer: Medicare Other

## 2019-11-15 DIAGNOSIS — Y999 Unspecified external cause status: Secondary | ICD-10-CM | POA: Insufficient documentation

## 2019-11-15 DIAGNOSIS — Y939 Activity, unspecified: Secondary | ICD-10-CM | POA: Diagnosis not present

## 2019-11-15 DIAGNOSIS — S52572A Other intraarticular fracture of lower end of left radius, initial encounter for closed fracture: Secondary | ICD-10-CM | POA: Insufficient documentation

## 2019-11-15 DIAGNOSIS — Y929 Unspecified place or not applicable: Secondary | ICD-10-CM | POA: Diagnosis not present

## 2019-11-15 DIAGNOSIS — X58XXXA Exposure to other specified factors, initial encounter: Secondary | ICD-10-CM | POA: Insufficient documentation

## 2019-11-15 DIAGNOSIS — W19XXXA Unspecified fall, initial encounter: Secondary | ICD-10-CM

## 2019-11-15 DIAGNOSIS — S6990XA Unspecified injury of unspecified wrist, hand and finger(s), initial encounter: Secondary | ICD-10-CM | POA: Diagnosis present

## 2019-11-15 MED ORDER — ACETAMINOPHEN 325 MG PO TABS
650.0000 mg | ORAL_TABLET | Freq: Once | ORAL | Status: AC
  Administered 2019-11-15: 650 mg via ORAL
  Filled 2019-11-15: qty 2

## 2019-11-15 NOTE — ED Triage Notes (Signed)
Pt states she had a mechanical fall and injured her L wrist. Alert and oriented.

## 2019-11-15 NOTE — ED Provider Notes (Signed)
[facility] DEPT Provider Note   CSN: [id number] Arrival date & time: 11/15/19  1251     History Chief Complaint  Patient presents with  . Wrist Injury    Sarah Burgess is a 79 y.o. female.  HPI    Patient presents with pain in her left wrist following a mechanical fall that occurred about 1 hour prior to ED arrival. Patient acknowledges multiple medical problems, including arthritis for which she typically takes Tylenol.  Since the fall she has had pain focally in the left wrist laterally.  Pain is worse with motion.  When she has not taken any medication, including Tylenol for relief per Though she acknowledges a complete fall, she denies pain in any other extremity or area. She states that she was well prior to the event. Past Medical History:  Diagnosis Date  . Allergy   . Arthritis   . Asthma   . Breast cancer ([place]) left  . Cancer [facility])    left breast and thyroid   . Diverticulitis   . GERD (gastroesophageal reflux disease)   . Hemorrhoids   . Hernia   . Personal history of chemotherapy   . Personal history of radiation therapy   . Pneumonia   . Thyroid disease     Patient Active Problem List   Diagnosis Date Noted  . CAP (community acquired pneumonia) 01/03/2016  . Pneumonia 01/03/2016  . Vaginal discharge 06/20/2013  . Abdominal pain, suprapubic 04/11/2013  . Left knee pain 01/26/2013  . Routine general medical examination at a health care facility 12/20/2012  . Allergic rhinitis 12/20/2012  . Rash and nonspecific skin eruption 09/12/2012  . UTI symptoms 01/12/2012  . HTN (hypertension) 08/25/2011  . Hyperlipidemia 08/25/2011  . Skin lesion 08/25/2011  . Mass of axilla 11/07/2010  . Cancer of overlapping sites of left female breast ([place]) 06/10/2009    Class: Stage 2    Past Surgical History:  Procedure Laterality Date  . ABDOMINAL HYSTERECTOMY    . BREAST LUMPECTOMY  2011   left breast  . CHOLECYSTECTOMY    .  PORT-A-CATH REMOVAL  02/23/2011   Procedure: REMOVAL PORT-A-CATH;  Surgeon: Haywood Lasso, MD;  Location: [facility];  Service: General;  Laterality: N/A;  remove access port right chest     OB History   No obstetric history on file.     Family History  Problem Relation Age of Onset  . Stroke Mother   . Heart disease Mother   . Early death Mother   . Diabetes Mother   . Cancer Father        pt unaware of what kind  . Heart disease Father   . Diabetes Father   . Heart disease Sister   . Diabetes Sister   . Arthritis Son   . Breast cancer Neg Hx     Social History   Tobacco Use  . Smoking status: Former Smoker    Quit date: 01/03/2000    Years since quitting: 19.8  . Smokeless tobacco: Never Used  Substance Use Topics  . Alcohol use: No  . Drug use: No    Home Medications Prior to Admission medications   Medication Sig Start Date End Date Taking? Authorizing Provider  atorvastatin (LIPITOR) 20 MG tablet Take 20 mg by mouth daily.    [provider]  Calcium Carbonate-Vitamin D (CALTRATE 600+D PO) Take 1 tablet by mouth daily.    [provider]  hydrochlorothiazide (MICROZIDE)  12.5 MG capsule TAKE ONE CAPSULE BY MOUTH ONE TIME DAILY  12/26/13   Midge Minium, MD  lisinopril (PRINIVIL,ZESTRIL) 40 MG tablet TAKE ONE TABLET BY MOUTH ONE TIME DAILY  12/26/13   Midge Minium, MD  metoprolol (TOPROL-XL) 200 MG 24 hr tablet TAKE ONE TABLET BY MOUTH ONE TIME DAILY  12/26/13   Midge Minium, MD  ranitidine (ZANTAC) 75 MG tablet Take 75 mg by mouth daily as needed for heartburn.     [provider]  solifenacin (VESICARE) 5 MG tablet Take 5 mg by mouth daily. 10/07/17   [provider]    Allergies    Sulfa drugs cross reactors, Tape, and Tramadol  Review of Systems   Review of Systems  Constitutional:       Per HPI, otherwise negative  HENT:       Per HPI, otherwise negative  Respiratory:       Per  HPI, otherwise negative  Cardiovascular:       Per HPI, otherwise negative  Gastrointestinal: Negative for vomiting.  Endocrine:       Negative aside from HPI  Genitourinary:       Neg aside from HPI   Musculoskeletal:       Per HPI, otherwise negative  Skin: Negative for wound.  Neurological: Negative for syncope.    Physical Exam Updated Vital Signs BP 106/63 (BP Location: Right Arm)   Pulse 93   Temp 97.9 F (36.6 C) (Oral)   Resp 18   SpO2 92%   Physical Exam Vitals and nursing note reviewed.  Constitutional:      General: She is not in acute distress.    Appearance: She is well-developed.  HENT:     Head: Normocephalic and atraumatic.  Eyes:     Conjunctiva/sclera: Conjunctivae normal.  Cardiovascular:     Rate and Rhythm: Normal rate and regular rhythm.  Pulmonary:     Effort: Pulmonary effort is normal. No respiratory distress.     Breath sounds: Normal breath sounds. No stridor.  Abdominal:     General: There is no distension.  Skin:    General: Skin is warm and dry.  Neurological:     Mental Status: She is alert and oriented to person, place, and time.     Cranial Nerves: No cranial nerve deficit.     ED Results / Procedures / Treatments    Radiology DG Wrist Complete Left  Result Date: 11/15/2019 CLINICAL DATA:  Left wrist pain after fall EXAM: LEFT WRIST - COMPLETE 3+ VIEW COMPARISON:  None. FINDINGS: Acute mildly impacted fracture of the distal radial metaphysis with intra-articular extension to the radiocarpal joint. 2 mm of articular-surface diastasis with slight dorsal displacement of a dominant fragment. Mildly displaced ulnar styloid fracture. Scapholunate interval is mildly widened. Carpal bones are intact without fracture. Soft tissue swelling at the fracture site. IMPRESSION: 1. Acute mildly impacted fracture of the distal radial metaphysis with intra-articular extension to the radiocarpal joint. 2. Mildly displaced ulnar styloid fracture. 3.  Mild widening of the scapholunate interval suggesting underlying ligamentous injury, age indeterminate. Electronically Signed   By: Davina Poke D.O.   On: 11/15/2019 13:42    Procedures .Ortho Injury Treatment  Date/Time: 11/15/2019 4:28 PM Performed by: Carmin Muskrat, MD Authorized by: Carmin Muskrat, MD   Consent:    Consent obtained:  Verbal   Consent given by:  Patient   Alternatives discussed:  No treatment Universal protocol:    Procedure explained  and questions answered to patient or proxy's satisfaction: yes     Relevant documents present and verified: yes     Test results available and properly labeled: yes     Imaging studies available: yes     Required blood products, implants, devices, and special equipment available: yes     Site/side marked: yes     Immediately prior to procedure a time out was called: yes     Patient identity confirmed:  Verbally with patientInjury location: wrist Location details: left wrist Injury type: fracture Fracture type: distal radius and ulnar styloid Pre-procedure neurovascular assessment: neurovascularly intact Pre-procedure distal perfusion: normal Pre-procedure neurological function: normal Pre-procedure range of motion: reduced  Anesthesia: Local anesthesia used: no  Patient sedated: NoManipulation performed: no Immobilization: splint and sling Supplies used: Ortho-Glass Post-procedure neurovascular assessment: post-procedure neurovascularly intact Post-procedure distal perfusion: normal Post-procedure neurological function: normal Post-procedure range of motion: unchanged Patient tolerance: patient tolerated the procedure well with no immediate complications    (including critical care time)  Medications Ordered in ED Medications  acetaminophen (TYLENOL) tablet 650 mg (650 mg Oral Given 11/15/19 1409)    ED Course  I have reviewed the triage vital signs and the nursing notes.  Pertinent labs & imaging results  that were available during my care of the patient were reviewed by me and considered in my medical decision making (see chart for details).  4:29 PM Patient tolerated splinting without complication. On repeat exam she is awake, alert, afebrile, speaking clearly. Reviewed the importance of following up with her orthopedic colleagues. I have discussed her presentation/fall/fracture with them, and we discussed her immobilization, splinting, need for outpatient follow-up, no indication for emergent surgery. Patient has no other complaints, no other injuries, is appropriate for discharge. Final Clinical Impression(s) / ED Diagnoses Final diagnoses:  Fall, initial encounter  Other closed intra-articular fracture of distal end of left radius, initial encounter     Carmin Muskrat, MD 11/15/19 [phone]

## 2019-11-15 NOTE — Discharge Instructions (Addendum)
Please be sure to call to schedule follow-up with our Hand Surgery colleague.  Return here for concerning changes in your condition.

## 2019-11-16 ENCOUNTER — Other Ambulatory Visit: Payer: Self-pay | Admitting: Plastic Surgery

## 2019-11-16 ENCOUNTER — Ambulatory Visit (HOSPITAL_COMMUNITY)
Admission: RE | Admit: 2019-11-16 | Discharge: 2019-11-16 | Disposition: A | Discharge status: Home / Self Care | Payer: Medicare Other | Source: Ambulatory Visit | Attending: Plastic Surgery | Admitting: Plastic Surgery

## 2019-11-16 DIAGNOSIS — S52572A Other intraarticular fracture of lower end of left radius, initial encounter for closed fracture: Secondary | ICD-10-CM

## 2019-11-22 ENCOUNTER — Encounter: Payer: Self-pay | Admitting: Plastic Surgery

## 2019-11-22 ENCOUNTER — Other Ambulatory Visit: Payer: Self-pay

## 2019-11-22 ENCOUNTER — Ambulatory Visit (HOSPITAL_COMMUNITY)
Admission: RE | Admit: 2019-11-22 | Discharge: 2019-11-22 | Disposition: A | Discharge status: Home / Self Care | Payer: Medicare Other | Source: Ambulatory Visit | Attending: Plastic Surgery | Admitting: Plastic Surgery

## 2019-11-22 ENCOUNTER — Ambulatory Visit (INDEPENDENT_AMBULATORY_CARE_PROVIDER_SITE_OTHER): Payer: Medicare Other | Admitting: Plastic Surgery

## 2019-11-22 VITALS — BP 133/89 | HR 77 | Temp 97.0°F | Ht 60.0 in | Wt 227.8 lb

## 2019-11-22 DIAGNOSIS — S52572A Other intraarticular fracture of lower end of left radius, initial encounter for closed fracture: Secondary | ICD-10-CM | POA: Diagnosis not present

## 2019-11-22 NOTE — Progress Notes (Signed)
Referring Provider Bernerd Limbo, MD [address] [place],  [place] [phone]   CC:  Chief Complaint  Patient presents with  . Consult      Sarah Burgess is an 79 y.o. female.  HPI: Patient presents to discuss a left wrist injury.  She had a fall last week in the emergency room found a displaced intra-articular distal radius fracture.  There is also concern for scapholunate widening.  Patient has pain in the wrist feels like otherwise she has good range of motion of her fingers.  She has baseline osteoarthritis which is caused some deformity of her fingers.  She takes care of her son who has had a stroke.  Allergies  Allergen Reactions  . Sulfa Drugs Cross Reactors Nausea Only    Made patient very ill   . Tape Hives and Itching    Breaks her out  . Tramadol Itching    Outpatient Encounter Medications as of 11/22/2019  Medication Sig  . atorvastatin (LIPITOR) 20 MG tablet Take 20 mg by mouth daily.  . Calcium Carbonate-Vitamin D (CALTRATE 600+D PO) Take 1 tablet by mouth daily.  . hydrochlorothiazide (MICROZIDE) 12.5 MG capsule TAKE ONE CAPSULE BY MOUTH ONE TIME DAILY   . lisinopril (PRINIVIL,ZESTRIL) 40 MG tablet TAKE ONE TABLET BY MOUTH ONE TIME DAILY   . metoprolol (TOPROL-XL) 200 MG 24 hr tablet TAKE ONE TABLET BY MOUTH ONE TIME DAILY   . ranitidine (ZANTAC) 75 MG tablet Take 75 mg by mouth daily as needed for heartburn.   . solifenacin (VESICARE) 5 MG tablet Take 5 mg by mouth daily.   No facility-administered encounter medications on file as of 11/22/2019.     Past Medical History:  Diagnosis Date  . Allergy   . Arthritis   . Asthma   . Breast cancer ([place]) left  . Cancer [facility])    left breast and thyroid   . Diverticulitis   . GERD (gastroesophageal reflux disease)   . Hemorrhoids   . Hernia   . Personal history of chemotherapy   . Personal history of radiation therapy   . Pneumonia   . Thyroid disease     Past Surgical History:    Procedure Laterality Date  . ABDOMINAL HYSTERECTOMY    . BREAST LUMPECTOMY  2011   left breast  . CHOLECYSTECTOMY    . PORT-A-CATH REMOVAL  02/23/2011   Procedure: REMOVAL PORT-A-CATH;  Surgeon: Haywood Lasso, MD;  Location: [facility];  Service: General;  Laterality: N/A;  remove access port right chest    Family History  Problem Relation Age of Onset  . Stroke Mother   . Heart disease Mother   . Early death Mother   . Diabetes Mother   . Cancer Father        pt unaware of what kind  . Heart disease Father   . Diabetes Father   . Heart disease Sister   . Diabetes Sister   . Arthritis Son   . Breast cancer Neg Hx     Social History   Social History Narrative  . Not on file     Review of Systems General: Denies fevers, chills, weight loss CV: Denies chest pain, shortness of breath, palpitations  Physical Exam Vitals with BMI 11/22/2019 11/15/2019 11/15/2019  Height 5\' 0"  - -  Weight 227 lbs 13 oz - -  BMI 58.85 - -  Systolic 027 741 287  Diastolic 89 72 63  Pulse  77 75 93    General:  No acute distress,  Alert and oriented, Non-Toxic, Normal speech and affect Left hand: Fingers well-perfused with normal cap refill palp radial pulse.  Sensation is intact throughout.  She has baseline range of motion.  There are some deformities of her index finger and thumb that are baseline.  She can flex and extend all fingers.  She has bruising and swelling around her wrist extending up her forearm.  There is no obvious external deformity.  X-rays were reviewed.  She had one the day of her injury and one the day following.  They are both fairly similar with a displaced intra-articular distal radius fracture.  There is not a significant displacement.  There is not much in the way of dorsal angulation or articular step-offs that are obvious to me.  Assessment/Plan Patient presents with intra-articular fracture of distal radius on the left side.  We discussed operative  and nonoperative strategies.  We eventually settled on getting another x-ray today and if it looks about the same then will commit to nonoperative management.  If there is more displacement then I have suggested surgical fixation.  We discussed the process for operative and nonoperative treatment strategies.  I explained if we would need to do surgery we should do it within the next week.  I discussed the risks and benefits of the procedure that include bleeding, infection, damage to surrounding structures, need for additional procedures.  We discussed the duration of expected immobilization.  I will plan to send her to hand therapy for a volar wrist splint to replace the sugar tong that she was placed in the emergency room.  All of her questions were answered.  If her x-ray looks good I will see her again in 3 weeks.  [name] 11/22/2019, 9:54 AM

## 2019-12-08 ENCOUNTER — Telehealth: Payer: Self-pay | Admitting: Plastic Surgery

## 2019-12-08 NOTE — Telephone Encounter (Signed)
Patient called to advise that her wrist is still hurting a lot and she wants to know if this is normal 3.5 weeks later. She said when she loosens up the splint, it relieves the pain a little. It's also a knot and she was curious if this is normal. Please call her to advise what she should be doing or what she should avoid.

## 2019-12-12 ENCOUNTER — Ambulatory Visit: Payer: Medicare Other | Admitting: *Deleted

## 2019-12-12 ENCOUNTER — Other Ambulatory Visit: Payer: Self-pay | Admitting: Surgical

## 2019-12-12 DIAGNOSIS — S52572A Other intraarticular fracture of lower end of left radius, initial encounter for closed fracture: Secondary | ICD-10-CM

## 2019-12-12 NOTE — Telephone Encounter (Signed)
Spoke with pt, she is coming to see me Thursday for eval. She is doing well. She is going to get x-ray AM prior to appt.

## 2019-12-13 NOTE — Progress Notes (Signed)
Subjective:     Patient ID: Sarah Burgess, female    DOB: [birth year], 79 y.o.   MRN: [id number]  Chief Complaint  Patient presents with  . Follow-up    HPI: The patient is a 79 y.o. female here for follow-up on her left wrist injury, patient had a fall and was seen in the emergency room and found to have a displaced intra-articular distal radius fracture and concern for scapholunate widening, patient was seen by Dr. Claudia Desanctis on 11/22/2019.   Patient is overall doing well, reports she has been going to therapy but has been doing about 1 day/week instead of 3 days/week due to financial reasons.  She feels as if this is going well and has been doing her exercises at home.  She has noticed some improvement in the swelling as well as the range of motion of her hand.  She reports some pain when wearing the wrist splint too tightly.  She had an x-ray prior to today's appointment and everything appears to be Burgess  Review of Systems  Constitutional: Negative.   Skin: Negative for color change and wound.  Neurological: Positive for weakness. Negative for numbness.     Objective:   Vital Signs Pulse 68   Temp 98.1 F (36.7 C) (Oral)   SpO2 (!) 88%  Vital Signs and Nursing Note Reviewed Physical Exam Constitutional:      Appearance: Normal appearance.  HENT:     Head: Normocephalic and atraumatic.  Musculoskeletal:     Left wrist: Swelling and tenderness present. No deformity. Normal pulse.     Left hand: Swelling and tenderness present. Decreased strength. Normal sensation. Normal capillary refill. Normal pulse.     Comments: Left hand with improved range of motion, swelling has improved, 2+ radial pulse, good capillary refill, normal sensation throughout.  She has tenderness to palpation of the distal wrist extending proximally a few centimeters.  There is no bruising noted.  She continues to have some decreased range of motion of the wrist as expected, reviewed x-ray images with  patient.  Skin:    General: Skin is warm and dry.     Capillary Refill: Capillary refill takes less than 2 seconds.  Neurological:     General: No focal deficit present.     Mental Status: She is alert and oriented to person, place, and time.  Psychiatric:        Mood and Affect: Mood normal.        Behavior: Behavior normal.     Assessment/Plan:     ICD-10-CM   1. Other closed intra-articular fracture of distal end of left radius, initial encounter  S52.572A     Overall the patient is doing well, discussed with her that pain at this time is normal, this will continue to improve over the next few months. Discussed with patient function may never return to 100%, but will continue to improve over time. I recommend she continue with occupational therapy for improvement in her function in her hand, she reports that she initially was going 3 times per week, but has been only going once per week due to financial reasons.  I discussed with her that if she is able to do the exercises at home as  Elevate the left wrist/hand to assist with swelling.  She is very pleased with how things are going so far and feels as if she is improving.  We discussed x-ray images today in the office together.   Recommend calling  with questions or concerns. Follow up scheduled for 4 weeks with Dr. Claudia Desanctis, would like one additional x-ray.   Carola Rhine Naheem Mosco, PA-C 12/14/2019, 12:05 PM

## 2019-12-14 ENCOUNTER — Ambulatory Visit (INDEPENDENT_AMBULATORY_CARE_PROVIDER_SITE_OTHER): Payer: Medicare Other | Admitting: Surgical

## 2019-12-14 ENCOUNTER — Ambulatory Visit (HOSPITAL_COMMUNITY)
Admission: RE | Admit: 2019-12-14 | Discharge: 2019-12-14 | Disposition: A | Discharge status: Home / Self Care | Payer: Medicare Other | Source: Ambulatory Visit | Attending: Surgical | Admitting: Surgical

## 2019-12-14 ENCOUNTER — Encounter: Payer: Self-pay | Admitting: Surgical

## 2019-12-14 ENCOUNTER — Other Ambulatory Visit: Payer: Self-pay

## 2019-12-14 VITALS — HR 68 | Temp 98.1°F

## 2019-12-14 DIAGNOSIS — S52572A Other intraarticular fracture of lower end of left radius, initial encounter for closed fracture: Secondary | ICD-10-CM

## 2020-01-01 ENCOUNTER — Telehealth: Payer: Self-pay | Admitting: *Deleted

## 2020-01-01 NOTE — Telephone Encounter (Signed)
Received Plan of Care on (01/01/20) via of fax from [facility].  Requesting signature and return.  Plan of Care signed and faxed back to [facility].  Confirmation received and copy scanned into the chart.//AB/CMA

## 2020-01-09 ENCOUNTER — Other Ambulatory Visit: Payer: Self-pay

## 2020-01-09 ENCOUNTER — Ambulatory Visit (HOSPITAL_COMMUNITY)
Admission: RE | Admit: 2020-01-09 | Discharge: 2020-01-09 | Disposition: A | Discharge status: Home / Self Care | Payer: Medicare Other | Source: Ambulatory Visit | Attending: Surgical | Admitting: Surgical

## 2020-01-09 DIAGNOSIS — S52572A Other intraarticular fracture of lower end of left radius, initial encounter for closed fracture: Secondary | ICD-10-CM | POA: Diagnosis present

## 2020-01-11 ENCOUNTER — Encounter: Payer: Self-pay | Admitting: Plastic Surgery

## 2020-01-11 ENCOUNTER — Other Ambulatory Visit: Payer: Self-pay

## 2020-01-11 ENCOUNTER — Ambulatory Visit (INDEPENDENT_AMBULATORY_CARE_PROVIDER_SITE_OTHER): Payer: Medicare Other | Admitting: Plastic Surgery

## 2020-01-11 VITALS — BP 153/82 | HR 70 | Temp 98.4°F

## 2020-01-11 DIAGNOSIS — S52572A Other intraarticular fracture of lower end of left radius, initial encounter for closed fracture: Secondary | ICD-10-CM | POA: Diagnosis not present

## 2020-01-11 NOTE — Progress Notes (Signed)
   Referring Provider Bernerd Limbo, MD [address] [place],  [place] [phone]   CC:  Chief Complaint  Patient presents with  . Follow-up      Sarah Burgess is an 79 y.o. female.  HPI: Patient presents now about 2 months after injuring her left wrist.  She had intra-articular distal radius fracture that we managed nonoperatively.  X-ray performed day before yesterday shows maintained alignment of the fracture segments with callus formation indicating healing.  She says she still has some pain on that side but she has pain on the uninjured side as well.  She feels like things are going well with her range of motion but her strength is coming along more slowly.  Review of Systems General: Denies fevers and chills  Physical Exam Vitals with BMI 01/11/2020 12/14/2019 11/22/2019  Height - - 5\' 0"   Weight - - 227 lbs 13 oz  BMI - - 77.82  Systolic 423 (No Data) 536  Diastolic 82 (No Data) 89  Pulse 70 68 77    General:  No acute distress,  Alert and oriented, Non-Toxic, Normal speech and affect Examination shows good sensation in the fingers and good range of motion in the fingers themselves.  Her wrist range of motion is actually pretty good as well.  She does feel weak on that side but otherwise she looks to be healing well.  Assessment/Plan Patient is doing reasonably well 2 months out from nonoperative treatment of the distal radius fracture.  We reviewed her films and discussed the plan going forward.  She desires to stop going to therapy because of the co-pay and she feels like she can do the exercises on her own.  I think she will continue to make progress despite using her hand and wrist and I will plan to see her again in 2 months.  [name] 01/11/2020, 5:06 PM

## 2020-03-14 ENCOUNTER — Ambulatory Visit: Payer: Medicare Other | Admitting: Plastic Surgery

## 2020-10-07 ENCOUNTER — Other Ambulatory Visit: Payer: Self-pay | Admitting: Family Medicine

## 2020-10-07 DIAGNOSIS — Z1231 Encounter for screening mammogram for malignant neoplasm of breast: Secondary | ICD-10-CM

## 2020-11-29 ENCOUNTER — Other Ambulatory Visit: Payer: Self-pay

## 2020-11-29 ENCOUNTER — Ambulatory Visit
Admission: RE | Admit: 2020-11-29 | Discharge: 2020-11-29 | Disposition: A | Discharge status: Home / Self Care | Payer: Medicare HMO | Source: Ambulatory Visit | Attending: Family Medicine | Admitting: Family Medicine

## 2020-11-29 DIAGNOSIS — Z1231 Encounter for screening mammogram for malignant neoplasm of breast: Secondary | ICD-10-CM

## 2021-11-10 ENCOUNTER — Other Ambulatory Visit: Payer: Self-pay | Admitting: Family Medicine

## 2021-11-10 DIAGNOSIS — Z1231 Encounter for screening mammogram for malignant neoplasm of breast: Secondary | ICD-10-CM

## 2021-12-02 ENCOUNTER — Ambulatory Visit
Admission: RE | Admit: 2021-12-02 | Discharge: 2021-12-02 | Disposition: A | Discharge status: Home / Self Care | Payer: Medicare HMO | Source: Ambulatory Visit | Attending: Family Medicine | Admitting: Family Medicine

## 2021-12-02 DIAGNOSIS — Z1231 Encounter for screening mammogram for malignant neoplasm of breast: Secondary | ICD-10-CM

## 2022-10-22 ENCOUNTER — Other Ambulatory Visit: Payer: Self-pay | Admitting: Family Medicine

## 2022-10-22 DIAGNOSIS — Z1231 Encounter for screening mammogram for malignant neoplasm of breast: Secondary | ICD-10-CM

## 2022-12-04 ENCOUNTER — Ambulatory Visit
Admission: RE | Admit: 2022-12-04 | Discharge: 2022-12-04 | Disposition: A | Discharge status: Home / Self Care | Payer: Medicare HMO | Source: Ambulatory Visit | Attending: Family Medicine | Admitting: Family Medicine

## 2022-12-04 DIAGNOSIS — Z1231 Encounter for screening mammogram for malignant neoplasm of breast: Secondary | ICD-10-CM

## 2024-01-11 ENCOUNTER — Other Ambulatory Visit: Payer: Self-pay | Admitting: Family Medicine

## 2024-01-11 DIAGNOSIS — Z1231 Encounter for screening mammogram for malignant neoplasm of breast: Secondary | ICD-10-CM

## 2024-01-27 ENCOUNTER — Ambulatory Visit

## 2024-02-16 ENCOUNTER — Ambulatory Visit

## 2024-02-22 ENCOUNTER — Ambulatory Visit
Admission: RE | Admit: 2024-02-22 | Discharge: 2024-02-22 | Disposition: A | Source: Ambulatory Visit | Attending: Family Medicine | Admitting: Family Medicine

## 2024-02-22 DIAGNOSIS — Z1231 Encounter for screening mammogram for malignant neoplasm of breast: Secondary | ICD-10-CM
